# Patient Record
Sex: Female | Born: 1950 | Race: White | Hispanic: No | Marital: Married | Smoking: Former smoker
Health system: Southern US, Community
[De-identification: ages and names within clinical notes are randomized; demographics above are authoritative.]

## PROBLEM LIST (undated history)

## (undated) DIAGNOSIS — R42 Dizziness and giddiness: Secondary | ICD-10-CM

## (undated) DIAGNOSIS — M8580 Other specified disorders of bone density and structure, unspecified site: Secondary | ICD-10-CM

## (undated) DIAGNOSIS — E785 Hyperlipidemia, unspecified: Secondary | ICD-10-CM

## (undated) DIAGNOSIS — K589 Irritable bowel syndrome without diarrhea: Secondary | ICD-10-CM

## (undated) DIAGNOSIS — E041 Nontoxic single thyroid nodule: Secondary | ICD-10-CM

## (undated) DIAGNOSIS — K5790 Diverticulosis of intestine, part unspecified, without perforation or abscess without bleeding: Secondary | ICD-10-CM

## (undated) DIAGNOSIS — F419 Anxiety disorder, unspecified: Secondary | ICD-10-CM

## (undated) DIAGNOSIS — I67858 Other hereditary cerebrovascular disease: Secondary | ICD-10-CM

## (undated) DIAGNOSIS — E7800 Pure hypercholesterolemia, unspecified: Secondary | ICD-10-CM

## (undated) DIAGNOSIS — C801 Malignant (primary) neoplasm, unspecified: Secondary | ICD-10-CM

## (undated) DIAGNOSIS — K635 Polyp of colon: Secondary | ICD-10-CM

## (undated) DIAGNOSIS — I10 Essential (primary) hypertension: Secondary | ICD-10-CM

## (undated) DIAGNOSIS — I679 Cerebrovascular disease, unspecified: Secondary | ICD-10-CM

## (undated) DIAGNOSIS — F32A Depression, unspecified: Secondary | ICD-10-CM

## (undated) HISTORY — DX: Polyp of colon: K63.5

## (undated) HISTORY — DX: Other specified disorders of bone density and structure, unspecified site: M85.80

## (undated) HISTORY — DX: Diverticulosis of intestine, part unspecified, without perforation or abscess without bleeding: K57.90

## (undated) HISTORY — DX: Nontoxic single thyroid nodule: E04.1

## (undated) HISTORY — DX: Essential (primary) hypertension: I10

## (undated) HISTORY — DX: Irritable bowel syndrome, unspecified: K58.9

## (undated) HISTORY — PX: TONSILLECTOMY: SUR1361

## (undated) HISTORY — DX: Other hereditary cerebrovascular disease: I67.858

## (undated) HISTORY — DX: Dizziness and giddiness: R42

## (undated) HISTORY — PX: CHOLECYSTECTOMY: SHX55

## (undated) HISTORY — PX: ADENOIDECTOMY: SUR15

## (undated) HISTORY — DX: Hyperlipidemia, unspecified: E78.5

## (undated) HISTORY — DX: Anxiety disorder, unspecified: F41.9

## (undated) HISTORY — DX: Depression, unspecified: F32.A

## (undated) HISTORY — DX: Pure hypercholesterolemia, unspecified: E78.00

## (undated) HISTORY — DX: Cerebrovascular disease, unspecified: I67.9

---

## 1998-08-02 ENCOUNTER — Other Ambulatory Visit: Admission: RE | Admit: 1998-08-02 | Discharge: 1998-08-02 | Payer: Self-pay | Admitting: Obstetrics and Gynecology

## 1998-08-03 ENCOUNTER — Other Ambulatory Visit: Admission: RE | Admit: 1998-08-03 | Discharge: 1998-08-03 | Payer: Self-pay | Admitting: Obstetrics and Gynecology

## 2017-06-12 DIAGNOSIS — C211 Malignant neoplasm of anal canal: Secondary | ICD-10-CM | POA: Diagnosis not present

## 2017-06-12 DIAGNOSIS — R590 Localized enlarged lymph nodes: Secondary | ICD-10-CM | POA: Diagnosis not present

## 2017-06-18 DIAGNOSIS — R590 Localized enlarged lymph nodes: Secondary | ICD-10-CM | POA: Diagnosis not present

## 2017-06-18 DIAGNOSIS — C211 Malignant neoplasm of anal canal: Secondary | ICD-10-CM | POA: Diagnosis not present

## 2017-06-30 DIAGNOSIS — C211 Malignant neoplasm of anal canal: Secondary | ICD-10-CM | POA: Diagnosis not present

## 2017-08-10 DIAGNOSIS — C211 Malignant neoplasm of anal canal: Secondary | ICD-10-CM | POA: Diagnosis not present

## 2017-08-20 DIAGNOSIS — R110 Nausea: Secondary | ICD-10-CM | POA: Diagnosis not present

## 2017-08-20 DIAGNOSIS — R197 Diarrhea, unspecified: Secondary | ICD-10-CM

## 2017-08-20 DIAGNOSIS — C210 Malignant neoplasm of anus, unspecified: Secondary | ICD-10-CM | POA: Diagnosis not present

## 2017-08-20 DIAGNOSIS — D72810 Lymphocytopenia: Secondary | ICD-10-CM

## 2017-08-20 DIAGNOSIS — D649 Anemia, unspecified: Secondary | ICD-10-CM | POA: Diagnosis not present

## 2017-08-21 DIAGNOSIS — D72810 Lymphocytopenia: Secondary | ICD-10-CM | POA: Diagnosis not present

## 2017-08-21 DIAGNOSIS — C210 Malignant neoplasm of anus, unspecified: Secondary | ICD-10-CM | POA: Diagnosis not present

## 2017-08-21 DIAGNOSIS — R110 Nausea: Secondary | ICD-10-CM | POA: Diagnosis not present

## 2017-08-21 DIAGNOSIS — R197 Diarrhea, unspecified: Secondary | ICD-10-CM | POA: Diagnosis not present

## 2017-08-21 DIAGNOSIS — D759 Disease of blood and blood-forming organs, unspecified: Secondary | ICD-10-CM

## 2017-08-22 DIAGNOSIS — E876 Hypokalemia: Secondary | ICD-10-CM | POA: Diagnosis not present

## 2017-08-22 DIAGNOSIS — E46 Unspecified protein-calorie malnutrition: Secondary | ICD-10-CM | POA: Diagnosis not present

## 2017-08-22 DIAGNOSIS — C210 Malignant neoplasm of anus, unspecified: Secondary | ICD-10-CM | POA: Diagnosis not present

## 2017-08-22 DIAGNOSIS — R110 Nausea: Secondary | ICD-10-CM | POA: Diagnosis not present

## 2017-08-22 DIAGNOSIS — D61818 Other pancytopenia: Secondary | ICD-10-CM

## 2017-08-22 DIAGNOSIS — R197 Diarrhea, unspecified: Secondary | ICD-10-CM | POA: Diagnosis not present

## 2017-08-22 DIAGNOSIS — Z923 Personal history of irradiation: Secondary | ICD-10-CM

## 2017-08-22 DIAGNOSIS — K521 Toxic gastroenteritis and colitis: Secondary | ICD-10-CM | POA: Diagnosis not present

## 2017-08-22 DIAGNOSIS — C211 Malignant neoplasm of anal canal: Secondary | ICD-10-CM

## 2017-08-22 DIAGNOSIS — Z9221 Personal history of antineoplastic chemotherapy: Secondary | ICD-10-CM | POA: Diagnosis not present

## 2017-08-22 DIAGNOSIS — D72810 Lymphocytopenia: Secondary | ICD-10-CM | POA: Diagnosis not present

## 2017-08-23 DIAGNOSIS — R110 Nausea: Secondary | ICD-10-CM | POA: Diagnosis not present

## 2017-08-23 DIAGNOSIS — R300 Dysuria: Secondary | ICD-10-CM | POA: Diagnosis not present

## 2017-08-23 DIAGNOSIS — D61810 Antineoplastic chemotherapy induced pancytopenia: Secondary | ICD-10-CM

## 2017-08-23 DIAGNOSIS — Z923 Personal history of irradiation: Secondary | ICD-10-CM

## 2017-08-23 DIAGNOSIS — D72810 Lymphocytopenia: Secondary | ICD-10-CM | POA: Diagnosis not present

## 2017-08-23 DIAGNOSIS — C211 Malignant neoplasm of anal canal: Secondary | ICD-10-CM | POA: Diagnosis not present

## 2017-08-23 DIAGNOSIS — C210 Malignant neoplasm of anus, unspecified: Secondary | ICD-10-CM | POA: Diagnosis not present

## 2017-08-23 DIAGNOSIS — Z9221 Personal history of antineoplastic chemotherapy: Secondary | ICD-10-CM | POA: Diagnosis not present

## 2017-08-23 DIAGNOSIS — R197 Diarrhea, unspecified: Secondary | ICD-10-CM | POA: Diagnosis not present

## 2017-08-24 DIAGNOSIS — R197 Diarrhea, unspecified: Secondary | ICD-10-CM | POA: Diagnosis not present

## 2017-08-24 DIAGNOSIS — C210 Malignant neoplasm of anus, unspecified: Secondary | ICD-10-CM | POA: Diagnosis not present

## 2017-08-24 DIAGNOSIS — R110 Nausea: Secondary | ICD-10-CM | POA: Diagnosis not present

## 2017-08-24 DIAGNOSIS — D72810 Lymphocytopenia: Secondary | ICD-10-CM | POA: Diagnosis not present

## 2017-09-24 DIAGNOSIS — Z923 Personal history of irradiation: Secondary | ICD-10-CM | POA: Diagnosis not present

## 2017-09-24 DIAGNOSIS — Z9221 Personal history of antineoplastic chemotherapy: Secondary | ICD-10-CM | POA: Diagnosis not present

## 2017-09-24 DIAGNOSIS — Z85048 Personal history of other malignant neoplasm of rectum, rectosigmoid junction, and anus: Secondary | ICD-10-CM | POA: Diagnosis not present

## 2017-11-05 DIAGNOSIS — Z9221 Personal history of antineoplastic chemotherapy: Secondary | ICD-10-CM | POA: Diagnosis not present

## 2017-11-05 DIAGNOSIS — Z923 Personal history of irradiation: Secondary | ICD-10-CM | POA: Diagnosis not present

## 2017-11-05 DIAGNOSIS — Z85048 Personal history of other malignant neoplasm of rectum, rectosigmoid junction, and anus: Secondary | ICD-10-CM | POA: Diagnosis not present

## 2017-12-09 ENCOUNTER — Ambulatory Visit (INDEPENDENT_AMBULATORY_CARE_PROVIDER_SITE_OTHER): Payer: BLUE CROSS/BLUE SHIELD

## 2017-12-09 ENCOUNTER — Ambulatory Visit: Payer: BLUE CROSS/BLUE SHIELD | Admitting: Sports Medicine

## 2017-12-09 ENCOUNTER — Encounter: Payer: Self-pay | Admitting: Sports Medicine

## 2017-12-09 VITALS — BP 112/69 | HR 97 | Temp 97.6°F | Resp 16 | Ht 63.0 in | Wt 140.0 lb

## 2017-12-09 DIAGNOSIS — M79672 Pain in left foot: Secondary | ICD-10-CM | POA: Diagnosis not present

## 2017-12-09 DIAGNOSIS — M79671 Pain in right foot: Secondary | ICD-10-CM

## 2017-12-09 DIAGNOSIS — G588 Other specified mononeuropathies: Secondary | ICD-10-CM | POA: Diagnosis not present

## 2017-12-09 DIAGNOSIS — M779 Enthesopathy, unspecified: Secondary | ICD-10-CM

## 2017-12-09 MED ORDER — TRIAMCINOLONE ACETONIDE 10 MG/ML IJ SUSP: 10.0000 mg | Freq: Once | INTRAMUSCULAR | Status: AC

## 2017-12-09 NOTE — Progress Notes (Signed)
Subjective: Chelsea Norman is a 67 y.o. female patient who presents to office for evaluation of left and right foot pain for years. Patient complains of progressive pain especially over the last few months that she wanted to have evaluated before she starts working back out at [employer], ranks pain 7/10 with numbness and pain to the left second third and fourth toes history of neuroma several years ago and has tried treating it with good supportive shoes and resting.  Patient denies any injury to the left foot.  Patient also has pain along the lateral side of the right foot and ankle states that the pain is off and on more of a bothersome and heart to describe the discomfort that she feels since it is so occasional.  Patient states that it is difficult to rank the pain because it comes and goes.  Patient has tried ibuprofen with no relief in symptoms. Patient denies any other pedal complaints. Denies injury/trip/fall/sprain/any causative factors.   Admits a history of cancer and has completed chemo and radiation.  Review of Systems  Musculoskeletal: Positive for joint pain and myalgias.  Neurological: Positive for sensory change.  All other systems reviewed and are negative.    There are no active problems to display for this patient.   Current Outpatient Medications on File Prior to Visit  Medication Sig Dispense Refill  . LORazepam (ATIVAN) 0.5 MG tablet Take 0.5 mg by mouth every 8 (eight) hours.     No current facility-administered medications on file prior to visit.     Not on File  Objective:  General: Alert and oriented x3 in no acute distress  Dermatology: No open lesions bilateral lower extremities, no webspace macerations, no ecchymosis bilateral, all nails x 10 are well manicured.  Vascular: Dorsalis Pedis and Posterior Tibial pedal pulses palpable 1 out of 4, Capillary Fill Time 3 seconds,(+) pedal hair growth bilateral, no edema bilateral lower extremities, mild  varicosities bilateral, temperature gradient within normal limits.  Neurology: Gross sensation intact via light touch bilateral, negative Mulder sign on the left however there is mild pain in between the second and third and third and fourth toes on the left.  Musculoskeletal: Mild tenderness with palpation at left forefoot and right lateral foot and ankle along the peroneal tendon course, range of motion normal however there is mild guarding at the right foot and ankle, strength within normal limits in all groups bilateral.  Bunion and hammertoe deformity bilateral.  Gait: Mildly antalgic gait  Xrays  Right and left foot   Impression: Normal osseous mineralization, no fracture, bunion and hammertoe deformity no other acute findings.  Assessment and Plan: Problem List Items Addressed This Visit    None    Visit Diagnoses    Foot pain, bilateral    -  Primary   Relevant Orders   DG Foot Complete Right   DG Foot Complete Left   Neuroma digital nerve       L   Relevant Medications   triamcinolone acetonide (KENALOG) 10 MG/ML injection 10 mg (Start on 12/09/2017  2:00 PM)   Tendonitis       R      -Complete examination performed -Xrays reviewed -Discussed treatement options for neuroma on left and tendinitis on the right -After oral consent and aseptic prep, injected a mixture containing 1 ml of 2%  plain lidocaine, 1 ml 0.5% plain marcaine, 0.5 ml of kenalog 10 and 0.5 ml of dexamethasone phosphate into left second and  third webspacewithout complication. Post-injection care discussed with patient.  -Recommend rest ice elevation for the left -Dispensed an ankle gauntlet to use on the right to assist with offloading the peroneal tendons which is likely inflamed and causing problems with her gait and activity -Advised rest ice elevation for the right foot and ankle Recommend slowly to increase activities to tolerance -Patient to return to office in 4 weeks for follow-up evaluation or  sooner if condition worsens.  Landis Martins, DPM

## 2017-12-10 ENCOUNTER — Other Ambulatory Visit: Payer: Self-pay | Admitting: Sports Medicine

## 2017-12-10 DIAGNOSIS — M779 Enthesopathy, unspecified: Secondary | ICD-10-CM

## 2017-12-10 DIAGNOSIS — M79671 Pain in right foot: Secondary | ICD-10-CM

## 2017-12-10 DIAGNOSIS — M79672 Pain in left foot: Principal | ICD-10-CM

## 2017-12-10 DIAGNOSIS — G588 Other specified mononeuropathies: Secondary | ICD-10-CM

## 2018-01-08 ENCOUNTER — Encounter: Payer: Self-pay | Admitting: Sports Medicine

## 2018-01-08 ENCOUNTER — Ambulatory Visit: Payer: BLUE CROSS/BLUE SHIELD | Admitting: Sports Medicine

## 2018-01-08 DIAGNOSIS — M779 Enthesopathy, unspecified: Secondary | ICD-10-CM | POA: Diagnosis not present

## 2018-01-08 DIAGNOSIS — G588 Other specified mononeuropathies: Secondary | ICD-10-CM | POA: Diagnosis not present

## 2018-01-08 DIAGNOSIS — M79672 Pain in left foot: Secondary | ICD-10-CM | POA: Diagnosis not present

## 2018-01-08 DIAGNOSIS — M79671 Pain in right foot: Secondary | ICD-10-CM

## 2018-01-08 NOTE — Progress Notes (Signed)
Subjective: Chelsea Norman is a 67 y.o. female patient returns to office for follow up evaluation of bilateral foot pain. Patient reports that she feels much better. No pain to left foot at the ball however still feels a little pain every now and again at right ankle. 50% improved. Brace helps. Denies new symptoms or swelling, brusing, cramping, or any other symptoms at this time.   There are no active problems to display for this patient.   Current Outpatient Medications on File Prior to Visit  Medication Sig Dispense Refill  . calcium-vitamin D (OSCAL WITH D) 500-200 MG-UNIT tablet Take 1 tablet by mouth.    . fexofenadine (ALLEGRA) 180 MG tablet Take 180 mg by mouth daily.    Marland Kitchen LORazepam (ATIVAN) 0.5 MG tablet Take 0.5 mg by mouth every 8 (eight) hours.    Marland Kitchen venlafaxine XR (EFFEXOR-XR) 150 MG 24 hr capsule Take 150 mg by mouth daily with breakfast.     Current Facility-Administered Medications on File Prior to Visit  Medication Dose Route Frequency Provider Last Rate Last Dose  . triamcinolone acetonide (KENALOG) 10 MG/ML injection 10 mg  10 mg Other Once Landis Martins, DPM        Allergies  Allergen Reactions  . Penicillins     Objective:  General: Alert and oriented x3 in no acute distress  Dermatology: No open lesions bilateral lower extremities, no webspace macerations, no ecchymosis bilateral, all nails x 10 are well manicured.  Vascular: Dorsalis Pedis and Posterior Tibial pedal pulses palpable 1 out of 4, Capillary Fill Time 3 seconds,(+) pedal hair growth bilateral, no edema bilateral lower extremities, mild varicosities bilateral, temperature gradient within normal limits.  Neurology: Gross sensation intact via light touch bilateral.  Musculoskeletal: No reproducible tenderness with palpation at left forefoot and right lateral foot and ankle along the peroneal tendon course, range of motion normal, strength within normal limits in all groups bilateral.  Bunion and  hammertoe deformity bilateral.  Assessment and Plan: Problem List Items Addressed This Visit    None    Visit Diagnoses    Tendonitis    -  Primary   R   Neuroma digital nerve       L   Foot pain, bilateral          -Complete examination performed -Discussed continued care for neuroma on left and tendinitis on the right -Continue with brace on right -Recommend avoid strenenous activity that could cause a flare in symptoms -Continue with good supportive shoes -Patient to return to office as needed or sooner if condition worsens.  Landis Martins, DPM

## 2018-01-27 DIAGNOSIS — C211 Malignant neoplasm of anal canal: Secondary | ICD-10-CM | POA: Diagnosis not present

## 2018-03-29 DIAGNOSIS — R52 Pain, unspecified: Secondary | ICD-10-CM | POA: Diagnosis not present

## 2018-03-29 DIAGNOSIS — Z809 Family history of malignant neoplasm, unspecified: Secondary | ICD-10-CM | POA: Diagnosis not present

## 2018-03-29 DIAGNOSIS — H269 Unspecified cataract: Secondary | ICD-10-CM | POA: Diagnosis not present

## 2018-03-29 DIAGNOSIS — Z791 Long term (current) use of non-steroidal anti-inflammatories (NSAID): Secondary | ICD-10-CM | POA: Diagnosis not present

## 2018-03-29 DIAGNOSIS — K30 Functional dyspepsia: Secondary | ICD-10-CM | POA: Diagnosis not present

## 2018-03-29 DIAGNOSIS — R69 Illness, unspecified: Secondary | ICD-10-CM | POA: Diagnosis not present

## 2018-03-29 DIAGNOSIS — J302 Other seasonal allergic rhinitis: Secondary | ICD-10-CM | POA: Diagnosis not present

## 2018-03-29 DIAGNOSIS — Z818 Family history of other mental and behavioral disorders: Secondary | ICD-10-CM | POA: Diagnosis not present

## 2018-03-29 DIAGNOSIS — F419 Anxiety disorder, unspecified: Secondary | ICD-10-CM | POA: Diagnosis not present

## 2018-05-03 DIAGNOSIS — R69 Illness, unspecified: Secondary | ICD-10-CM | POA: Diagnosis not present

## 2018-05-07 DIAGNOSIS — Z923 Personal history of irradiation: Secondary | ICD-10-CM | POA: Diagnosis not present

## 2018-05-07 DIAGNOSIS — Z9221 Personal history of antineoplastic chemotherapy: Secondary | ICD-10-CM | POA: Diagnosis not present

## 2018-05-07 DIAGNOSIS — Z85048 Personal history of other malignant neoplasm of rectum, rectosigmoid junction, and anus: Secondary | ICD-10-CM | POA: Diagnosis not present

## 2018-05-17 DIAGNOSIS — K5730 Diverticulosis of large intestine without perforation or abscess without bleeding: Secondary | ICD-10-CM | POA: Diagnosis not present

## 2018-05-17 DIAGNOSIS — Z85048 Personal history of other malignant neoplasm of rectum, rectosigmoid junction, and anus: Secondary | ICD-10-CM | POA: Diagnosis not present

## 2018-05-24 DIAGNOSIS — R69 Illness, unspecified: Secondary | ICD-10-CM | POA: Diagnosis not present

## 2018-06-01 DIAGNOSIS — Z923 Personal history of irradiation: Secondary | ICD-10-CM | POA: Diagnosis not present

## 2018-06-01 DIAGNOSIS — E7800 Pure hypercholesterolemia, unspecified: Secondary | ICD-10-CM | POA: Diagnosis not present

## 2018-06-01 DIAGNOSIS — Z87891 Personal history of nicotine dependence: Secondary | ICD-10-CM | POA: Diagnosis not present

## 2018-06-01 DIAGNOSIS — Z79899 Other long term (current) drug therapy: Secondary | ICD-10-CM | POA: Diagnosis not present

## 2018-06-01 DIAGNOSIS — Z85048 Personal history of other malignant neoplasm of rectum, rectosigmoid junction, and anus: Secondary | ICD-10-CM | POA: Diagnosis not present

## 2018-06-01 DIAGNOSIS — Z86010 Personal history of colonic polyps: Secondary | ICD-10-CM | POA: Diagnosis not present

## 2018-06-01 DIAGNOSIS — C210 Malignant neoplasm of anus, unspecified: Secondary | ICD-10-CM | POA: Diagnosis not present

## 2018-06-01 DIAGNOSIS — Z08 Encounter for follow-up examination after completed treatment for malignant neoplasm: Secondary | ICD-10-CM | POA: Diagnosis not present

## 2018-06-01 DIAGNOSIS — Z9221 Personal history of antineoplastic chemotherapy: Secondary | ICD-10-CM | POA: Diagnosis not present

## 2018-06-01 DIAGNOSIS — R69 Illness, unspecified: Secondary | ICD-10-CM | POA: Diagnosis not present

## 2018-06-04 DIAGNOSIS — Z6825 Body mass index (BMI) 25.0-25.9, adult: Secondary | ICD-10-CM | POA: Diagnosis not present

## 2018-06-04 DIAGNOSIS — E7800 Pure hypercholesterolemia, unspecified: Secondary | ICD-10-CM | POA: Diagnosis not present

## 2018-06-04 DIAGNOSIS — R69 Illness, unspecified: Secondary | ICD-10-CM | POA: Diagnosis not present

## 2018-06-04 DIAGNOSIS — Z79899 Other long term (current) drug therapy: Secondary | ICD-10-CM | POA: Diagnosis not present

## 2018-06-04 DIAGNOSIS — L309 Dermatitis, unspecified: Secondary | ICD-10-CM | POA: Diagnosis not present

## 2018-06-24 DIAGNOSIS — R7989 Other specified abnormal findings of blood chemistry: Secondary | ICD-10-CM | POA: Diagnosis not present

## 2018-06-24 DIAGNOSIS — R209 Unspecified disturbances of skin sensation: Secondary | ICD-10-CM | POA: Diagnosis not present

## 2018-07-06 DIAGNOSIS — Z1231 Encounter for screening mammogram for malignant neoplasm of breast: Secondary | ICD-10-CM | POA: Diagnosis not present

## 2018-07-15 DIAGNOSIS — Z6824 Body mass index (BMI) 24.0-24.9, adult: Secondary | ICD-10-CM | POA: Diagnosis not present

## 2018-07-15 DIAGNOSIS — J069 Acute upper respiratory infection, unspecified: Secondary | ICD-10-CM | POA: Diagnosis not present

## 2018-07-30 DIAGNOSIS — Z85048 Personal history of other malignant neoplasm of rectum, rectosigmoid junction, and anus: Secondary | ICD-10-CM | POA: Diagnosis not present

## 2018-11-05 DIAGNOSIS — Z9221 Personal history of antineoplastic chemotherapy: Secondary | ICD-10-CM | POA: Diagnosis not present

## 2018-11-05 DIAGNOSIS — Z85048 Personal history of other malignant neoplasm of rectum, rectosigmoid junction, and anus: Secondary | ICD-10-CM | POA: Diagnosis not present

## 2018-11-05 DIAGNOSIS — Z923 Personal history of irradiation: Secondary | ICD-10-CM | POA: Diagnosis not present

## 2018-12-03 DIAGNOSIS — Z6825 Body mass index (BMI) 25.0-25.9, adult: Secondary | ICD-10-CM | POA: Diagnosis not present

## 2018-12-03 DIAGNOSIS — Z79899 Other long term (current) drug therapy: Secondary | ICD-10-CM | POA: Diagnosis not present

## 2018-12-03 DIAGNOSIS — R69 Illness, unspecified: Secondary | ICD-10-CM | POA: Diagnosis not present

## 2018-12-23 DIAGNOSIS — R002 Palpitations: Secondary | ICD-10-CM | POA: Diagnosis not present

## 2018-12-23 DIAGNOSIS — R238 Other skin changes: Secondary | ICD-10-CM | POA: Diagnosis not present

## 2018-12-23 DIAGNOSIS — Z6825 Body mass index (BMI) 25.0-25.9, adult: Secondary | ICD-10-CM | POA: Diagnosis not present

## 2019-01-05 ENCOUNTER — Ambulatory Visit (INDEPENDENT_AMBULATORY_CARE_PROVIDER_SITE_OTHER): Payer: Medicare HMO

## 2019-01-05 ENCOUNTER — Other Ambulatory Visit: Payer: Self-pay | Admitting: Sports Medicine

## 2019-01-05 ENCOUNTER — Ambulatory Visit (INDEPENDENT_AMBULATORY_CARE_PROVIDER_SITE_OTHER): Payer: Medicare HMO | Admitting: Sports Medicine

## 2019-01-05 ENCOUNTER — Encounter: Payer: Self-pay | Admitting: Sports Medicine

## 2019-01-05 ENCOUNTER — Other Ambulatory Visit: Payer: Self-pay

## 2019-01-05 VITALS — Temp 97.3°F | Resp 16

## 2019-01-05 DIAGNOSIS — M779 Enthesopathy, unspecified: Secondary | ICD-10-CM

## 2019-01-05 DIAGNOSIS — G588 Other specified mononeuropathies: Secondary | ICD-10-CM

## 2019-01-05 DIAGNOSIS — M79675 Pain in left toe(s): Secondary | ICD-10-CM

## 2019-01-05 MED ORDER — TRIAMCINOLONE ACETONIDE 10 MG/ML IJ SUSP
10.0000 mg | Freq: Once | INTRAMUSCULAR | Status: AC
  Administered 2019-01-05: 10 mg

## 2019-01-05 NOTE — Progress Notes (Signed)
Subjective: Chelsea Norman is a 68 y.o. female patient who returns to office for follow up evaluation of left foot pain.  Patient reports that since September of last year she has been having pain that is worse with hiking or doing a lot of walking and standing underneath the second third and fourth toes reports that the pain can be as bad as 8-9 out of 10 states that the pain causes her to limp and has tried resting with some improvement however pain worsens with activity and use.  Patient admits that her right foot and ankle occasionally bothers her but is doing much better as long as she is active with the brace that we gave her last visit.  Patient denies any changes with medications or health history since last encounter.  Patient denies nausea vomiting fever chills or any other constitutional symptoms at this time.  There are no active problems to display for this patient.  Current Outpatient Medications on File Prior to Visit  Medication Sig Dispense Refill  . calcium-vitamin D (OSCAL WITH D) 500-200 MG-UNIT tablet Take 1 tablet by mouth.    . fexofenadine (ALLEGRA) 180 MG tablet Take 180 mg by mouth daily.    Marland Kitchen LORazepam (ATIVAN) 0.5 MG tablet Take 0.5 mg by mouth every 8 (eight) hours.    Marland Kitchen venlafaxine XR (EFFEXOR-XR) 150 MG 24 hr capsule Take 150 mg by mouth daily with breakfast.     Current Facility-Administered Medications on File Prior to Visit  Medication Dose Route Frequency Provider Last Rate Last Dose  . triamcinolone acetonide (KENALOG) 10 MG/ML injection 10 mg  10 mg Other Once Landis Martins, DPM       Allergies  Allergen Reactions  . Penicillins     Objective:  General: Alert and oriented x3 in no acute distress  Dermatology: No open lesions bilateral lower extremities, no webspace macerations, no ecchymosis bilateral, all nails x 10 are well manicured.  Neurovascular: Intact. No lower extremity edema bilateral.   Musculoskeletal: There is pain to palpation  diffusely plantar second third and fourth metatarsophalangeal joints on the left foot.  There is subjective occasional pain but nothing reproducible at the right lateral foot along the peroneal tendon course right greater than left. Range of motion in all pedal joints are within normal limits without pain or crepitation except at bunion deformity that is bilateral. Strength within normal limits in all groups bilateral.   X-ray left foot no acute or new findings from previous  Assessment and Plan: Problem List Items Addressed This Visit    None    Visit Diagnoses    Neuroma digital nerve    -  Primary   Relevant Medications   triamcinolone acetonide (KENALOG) 10 MG/ML injection 10 mg (Start on 01/05/2019 12:45 PM)   Other Relevant Orders   Uric acid   Sedimentation rate   C-reactive protein   Rheumatoid factor   ANA, IFA Comprehensive Panel   HLA-B27 antigen   CBC with Differential/Platelet   Capsulitis       Relevant Medications   triamcinolone acetonide (KENALOG) 10 MG/ML injection 10 mg (Start on 01/05/2019 12:45 PM)   Other Relevant Orders   Uric acid   Sedimentation rate   C-reactive protein   Rheumatoid factor   ANA, IFA Comprehensive Panel   HLA-B27 antigen   CBC with Differential/Platelet   Toe pain, left       Relevant Medications   triamcinolone acetonide (KENALOG) 10 MG/ML injection 10 mg (Start on  01/05/2019 12:45 PM)      -Complete examination performed -Discussed treatement options for capsulitis versus neuroma with possibility of underlying inflammatory conditions since this keeps recurring versus mechanical overuse -After oral consent and aseptic prep, injected a mixture containing 1 ml of 2%  plain lidocaine, 1 ml 0.5% plain marcaine, 0.5 ml of kenalog 10 and 0.5 ml of dexamethasone phosphate into left plantar forefoot without complication. Post-injection care discussed with patient.  -Rx arthritic panel for further evaluation advised patient to have her blood  work sent from her PCPs office and in addition to get the arthritic panel will call patient with results and discuss possible follow-up treatment course and plan -Patient to return to office after blood work or sooner if condition worsens.  Landis Martins, DPM

## 2019-01-06 DIAGNOSIS — G588 Other specified mononeuropathies: Secondary | ICD-10-CM | POA: Diagnosis not present

## 2019-01-06 DIAGNOSIS — M779 Enthesopathy, unspecified: Secondary | ICD-10-CM | POA: Diagnosis not present

## 2019-01-13 DIAGNOSIS — R2689 Other abnormalities of gait and mobility: Secondary | ICD-10-CM | POA: Diagnosis not present

## 2019-01-13 DIAGNOSIS — Z6825 Body mass index (BMI) 25.0-25.9, adult: Secondary | ICD-10-CM | POA: Diagnosis not present

## 2019-01-13 DIAGNOSIS — Z859 Personal history of malignant neoplasm, unspecified: Secondary | ICD-10-CM | POA: Diagnosis not present

## 2019-01-13 DIAGNOSIS — W19XXXA Unspecified fall, initial encounter: Secondary | ICD-10-CM | POA: Diagnosis not present

## 2019-01-13 DIAGNOSIS — R51 Headache: Secondary | ICD-10-CM | POA: Diagnosis not present

## 2019-01-18 ENCOUNTER — Other Ambulatory Visit: Payer: Self-pay | Admitting: Sports Medicine

## 2019-01-18 DIAGNOSIS — M6281 Muscle weakness (generalized): Secondary | ICD-10-CM | POA: Diagnosis not present

## 2019-01-18 DIAGNOSIS — R2681 Unsteadiness on feet: Secondary | ICD-10-CM | POA: Diagnosis not present

## 2019-01-18 DIAGNOSIS — G588 Other specified mononeuropathies: Secondary | ICD-10-CM

## 2019-01-18 DIAGNOSIS — R2689 Other abnormalities of gait and mobility: Secondary | ICD-10-CM | POA: Diagnosis not present

## 2019-01-18 DIAGNOSIS — M779 Enthesopathy, unspecified: Secondary | ICD-10-CM

## 2019-01-26 DIAGNOSIS — M6281 Muscle weakness (generalized): Secondary | ICD-10-CM | POA: Diagnosis not present

## 2019-01-26 DIAGNOSIS — R2689 Other abnormalities of gait and mobility: Secondary | ICD-10-CM | POA: Diagnosis not present

## 2019-01-26 DIAGNOSIS — R2681 Unsteadiness on feet: Secondary | ICD-10-CM | POA: Diagnosis not present

## 2019-01-28 ENCOUNTER — Ambulatory Visit (INDEPENDENT_AMBULATORY_CARE_PROVIDER_SITE_OTHER): Payer: Medicare HMO | Admitting: Sports Medicine

## 2019-01-28 ENCOUNTER — Other Ambulatory Visit: Payer: Self-pay

## 2019-01-28 ENCOUNTER — Ambulatory Visit (INDEPENDENT_AMBULATORY_CARE_PROVIDER_SITE_OTHER): Payer: Medicare HMO

## 2019-01-28 ENCOUNTER — Other Ambulatory Visit: Payer: Self-pay | Admitting: Sports Medicine

## 2019-01-28 ENCOUNTER — Encounter: Payer: Self-pay | Admitting: Sports Medicine

## 2019-01-28 VITALS — Temp 97.0°F | Resp 16

## 2019-01-28 DIAGNOSIS — R52 Pain, unspecified: Secondary | ICD-10-CM

## 2019-01-28 DIAGNOSIS — M779 Enthesopathy, unspecified: Secondary | ICD-10-CM

## 2019-01-28 DIAGNOSIS — M79675 Pain in left toe(s): Secondary | ICD-10-CM | POA: Diagnosis not present

## 2019-01-28 DIAGNOSIS — M7752 Other enthesopathy of left foot: Secondary | ICD-10-CM | POA: Diagnosis not present

## 2019-01-28 DIAGNOSIS — G588 Other specified mononeuropathies: Secondary | ICD-10-CM

## 2019-01-28 MED ORDER — TRIAMCINOLONE ACETONIDE 10 MG/ML IJ SUSP
10.0000 mg | Freq: Once | INTRAMUSCULAR | Status: AC
  Administered 2019-01-28: 10:00:00 10 mg

## 2019-01-28 NOTE — Progress Notes (Signed)
Subjective: Chelsea Norman is a 68 y.o. female patient who returns to office for follow up evaluation of left foot pain.  Patient reports that pain has flared back up after starting PT. Reports after PT on Wednesday had some pain to ball of left foot at toes 2-4. Patient reports that she got relief after last shot and reports that they called her about her blood work and it was negative for arthritis. Patient denies nausea vomiting fever chills or any other constitutional symptoms at this time.  There are no active problems to display for this patient.  Current Outpatient Medications on File Prior to Visit  Medication Sig Dispense Refill  . calcium-vitamin D (OSCAL WITH D) 500-200 MG-UNIT tablet Take 1 tablet by mouth.    . fexofenadine (ALLEGRA) 180 MG tablet Take 180 mg by mouth daily.    Marland Kitchen LORazepam (ATIVAN) 0.5 MG tablet Take 0.5 mg by mouth every 8 (eight) hours.    Marland Kitchen venlafaxine XR (EFFEXOR-XR) 150 MG 24 hr capsule Take 150 mg by mouth daily with breakfast.     Current Facility-Administered Medications on File Prior to Visit  Medication Dose Route Frequency Provider Last Rate Last Dose  . triamcinolone acetonide (KENALOG) 10 MG/ML injection 10 mg  10 mg Other Once Landis Martins, DPM       Allergies  Allergen Reactions  . Penicillins     Objective:  General: Alert and oriented x3 in no acute distress  Dermatology: No open lesions bilateral lower extremities, no webspace macerations, no ecchymosis bilateral, all nails x 10 are well manicured.  Neurovascular: Intact. No lower extremity edema bilateral.   Musculoskeletal: There is pain to palpation diffusely plantar second third and fourth metatarsophalangeal joints on the left foot with most pain under the 3rd MTPJ on left. Range of motion in all pedal joints are within normal limits without pain or crepitation except at bunion deformity that is bilateral. Strength within normal limits in all groups bilateral.   X-ray left  foot no acute or new findings from previous  Assessment and Plan: Problem List Items Addressed This Visit    None    Visit Diagnoses    Capsulitis    -  Primary   Relevant Medications   triamcinolone acetonide (KENALOG) 10 MG/ML injection 10 mg (Start on 01/28/2019  9:45 AM)   Toe pain, left       Relevant Medications   triamcinolone acetonide (KENALOG) 10 MG/ML injection 10 mg (Start on 01/28/2019  9:45 AM)   Neuroma digital nerve          -Complete examination performed -Xrays reviewed  -Discussed treatement options for capsulitis/neuroma versus mechanical overuse -After oral consent and aseptic prep, injected a mixture containing 1 ml of 2%  plain lidocaine, 1 ml 0.5% plain marcaine, 0.5 ml of kenalog 10 and 0.5 ml of dexamethasone phosphate into left plantar forefoot without complication. Injection #2 to area. Post-injection care discussed with patient.  -Advised patient to avoid activities that will inflame the ball -Advised patient to discuss with PT at her next visit -Advised patient if met pad works well will benefit from orthotics -Patient to return to office if not better after 2 weeks or sooner if condition worsens.  Landis Martins, DPM

## 2019-01-31 DIAGNOSIS — M6281 Muscle weakness (generalized): Secondary | ICD-10-CM | POA: Diagnosis not present

## 2019-01-31 DIAGNOSIS — R2681 Unsteadiness on feet: Secondary | ICD-10-CM | POA: Diagnosis not present

## 2019-01-31 DIAGNOSIS — R2689 Other abnormalities of gait and mobility: Secondary | ICD-10-CM | POA: Diagnosis not present

## 2019-02-02 ENCOUNTER — Other Ambulatory Visit: Payer: Self-pay | Admitting: Sports Medicine

## 2019-02-02 DIAGNOSIS — R52 Pain, unspecified: Secondary | ICD-10-CM

## 2019-02-02 DIAGNOSIS — M779 Enthesopathy, unspecified: Secondary | ICD-10-CM

## 2019-02-07 DIAGNOSIS — R2681 Unsteadiness on feet: Secondary | ICD-10-CM | POA: Diagnosis not present

## 2019-02-07 DIAGNOSIS — R2689 Other abnormalities of gait and mobility: Secondary | ICD-10-CM | POA: Diagnosis not present

## 2019-02-07 DIAGNOSIS — M6281 Muscle weakness (generalized): Secondary | ICD-10-CM | POA: Diagnosis not present

## 2019-02-10 DIAGNOSIS — R69 Illness, unspecified: Secondary | ICD-10-CM | POA: Diagnosis not present

## 2019-02-15 DIAGNOSIS — M6281 Muscle weakness (generalized): Secondary | ICD-10-CM | POA: Diagnosis not present

## 2019-02-15 DIAGNOSIS — R2681 Unsteadiness on feet: Secondary | ICD-10-CM | POA: Diagnosis not present

## 2019-02-15 DIAGNOSIS — R2689 Other abnormalities of gait and mobility: Secondary | ICD-10-CM | POA: Diagnosis not present

## 2019-02-21 DIAGNOSIS — R51 Headache: Secondary | ICD-10-CM | POA: Diagnosis not present

## 2019-02-21 DIAGNOSIS — S0990XA Unspecified injury of head, initial encounter: Secondary | ICD-10-CM | POA: Diagnosis not present

## 2019-02-21 DIAGNOSIS — W19XXXA Unspecified fall, initial encounter: Secondary | ICD-10-CM | POA: Diagnosis not present

## 2019-02-21 DIAGNOSIS — Z859 Personal history of malignant neoplasm, unspecified: Secondary | ICD-10-CM | POA: Diagnosis not present

## 2019-02-24 DIAGNOSIS — I679 Cerebrovascular disease, unspecified: Secondary | ICD-10-CM | POA: Diagnosis not present

## 2019-02-24 DIAGNOSIS — E785 Hyperlipidemia, unspecified: Secondary | ICD-10-CM | POA: Diagnosis not present

## 2019-03-01 DIAGNOSIS — R2681 Unsteadiness on feet: Secondary | ICD-10-CM | POA: Diagnosis not present

## 2019-03-01 DIAGNOSIS — R2689 Other abnormalities of gait and mobility: Secondary | ICD-10-CM | POA: Diagnosis not present

## 2019-03-01 DIAGNOSIS — M6281 Muscle weakness (generalized): Secondary | ICD-10-CM | POA: Diagnosis not present

## 2019-03-07 DIAGNOSIS — R2689 Other abnormalities of gait and mobility: Secondary | ICD-10-CM | POA: Diagnosis not present

## 2019-03-07 DIAGNOSIS — R2681 Unsteadiness on feet: Secondary | ICD-10-CM | POA: Diagnosis not present

## 2019-03-07 DIAGNOSIS — M6281 Muscle weakness (generalized): Secondary | ICD-10-CM | POA: Diagnosis not present

## 2019-03-18 DIAGNOSIS — R2681 Unsteadiness on feet: Secondary | ICD-10-CM | POA: Diagnosis not present

## 2019-03-18 DIAGNOSIS — R2689 Other abnormalities of gait and mobility: Secondary | ICD-10-CM | POA: Diagnosis not present

## 2019-03-18 DIAGNOSIS — M6281 Muscle weakness (generalized): Secondary | ICD-10-CM | POA: Diagnosis not present

## 2019-03-25 DIAGNOSIS — R69 Illness, unspecified: Secondary | ICD-10-CM | POA: Diagnosis not present

## 2019-04-08 DIAGNOSIS — Z1231 Encounter for screening mammogram for malignant neoplasm of breast: Secondary | ICD-10-CM | POA: Diagnosis not present

## 2019-04-08 DIAGNOSIS — Z1331 Encounter for screening for depression: Secondary | ICD-10-CM | POA: Diagnosis not present

## 2019-04-08 DIAGNOSIS — E2839 Other primary ovarian failure: Secondary | ICD-10-CM | POA: Diagnosis not present

## 2019-04-08 DIAGNOSIS — Z6825 Body mass index (BMI) 25.0-25.9, adult: Secondary | ICD-10-CM | POA: Diagnosis not present

## 2019-04-08 DIAGNOSIS — Z23 Encounter for immunization: Secondary | ICD-10-CM | POA: Diagnosis not present

## 2019-04-08 DIAGNOSIS — Z85048 Personal history of other malignant neoplasm of rectum, rectosigmoid junction, and anus: Secondary | ICD-10-CM | POA: Diagnosis not present

## 2019-04-08 DIAGNOSIS — Z1339 Encounter for screening examination for other mental health and behavioral disorders: Secondary | ICD-10-CM | POA: Diagnosis not present

## 2019-04-08 DIAGNOSIS — Z0000 Encounter for general adult medical examination without abnormal findings: Secondary | ICD-10-CM | POA: Diagnosis not present

## 2019-04-08 DIAGNOSIS — E663 Overweight: Secondary | ICD-10-CM | POA: Diagnosis not present

## 2019-04-08 DIAGNOSIS — R69 Illness, unspecified: Secondary | ICD-10-CM | POA: Diagnosis not present

## 2019-04-08 DIAGNOSIS — Z79899 Other long term (current) drug therapy: Secondary | ICD-10-CM | POA: Diagnosis not present

## 2019-04-25 DIAGNOSIS — I6523 Occlusion and stenosis of bilateral carotid arteries: Secondary | ICD-10-CM | POA: Diagnosis not present

## 2019-04-25 DIAGNOSIS — I679 Cerebrovascular disease, unspecified: Secondary | ICD-10-CM | POA: Diagnosis not present

## 2019-05-11 DIAGNOSIS — C211 Malignant neoplasm of anal canal: Secondary | ICD-10-CM | POA: Diagnosis not present

## 2019-05-11 DIAGNOSIS — C218 Malignant neoplasm of overlapping sites of rectum, anus and anal canal: Secondary | ICD-10-CM | POA: Diagnosis not present

## 2019-05-12 DIAGNOSIS — Z85048 Personal history of other malignant neoplasm of rectum, rectosigmoid junction, and anus: Secondary | ICD-10-CM | POA: Diagnosis not present

## 2019-05-23 DIAGNOSIS — Z86010 Personal history of colonic polyps: Secondary | ICD-10-CM | POA: Diagnosis not present

## 2019-05-23 DIAGNOSIS — C210 Malignant neoplasm of anus, unspecified: Secondary | ICD-10-CM | POA: Diagnosis not present

## 2019-06-06 DIAGNOSIS — Z6825 Body mass index (BMI) 25.0-25.9, adult: Secondary | ICD-10-CM | POA: Diagnosis not present

## 2019-06-06 DIAGNOSIS — R69 Illness, unspecified: Secondary | ICD-10-CM | POA: Diagnosis not present

## 2019-06-07 DIAGNOSIS — Z86010 Personal history of colonic polyps: Secondary | ICD-10-CM | POA: Diagnosis not present

## 2019-06-07 DIAGNOSIS — Z923 Personal history of irradiation: Secondary | ICD-10-CM | POA: Diagnosis not present

## 2019-06-07 DIAGNOSIS — Z85048 Personal history of other malignant neoplasm of rectum, rectosigmoid junction, and anus: Secondary | ICD-10-CM | POA: Diagnosis not present

## 2019-06-07 DIAGNOSIS — Z08 Encounter for follow-up examination after completed treatment for malignant neoplasm: Secondary | ICD-10-CM | POA: Diagnosis not present

## 2019-06-07 DIAGNOSIS — C210 Malignant neoplasm of anus, unspecified: Secondary | ICD-10-CM | POA: Diagnosis not present

## 2019-08-15 DIAGNOSIS — R69 Illness, unspecified: Secondary | ICD-10-CM | POA: Diagnosis not present

## 2019-09-01 DIAGNOSIS — H25013 Cortical age-related cataract, bilateral: Secondary | ICD-10-CM | POA: Diagnosis not present

## 2019-09-01 DIAGNOSIS — H524 Presbyopia: Secondary | ICD-10-CM | POA: Diagnosis not present

## 2019-09-01 DIAGNOSIS — H2513 Age-related nuclear cataract, bilateral: Secondary | ICD-10-CM | POA: Diagnosis not present

## 2019-09-05 DIAGNOSIS — E2839 Other primary ovarian failure: Secondary | ICD-10-CM | POA: Diagnosis not present

## 2019-09-05 DIAGNOSIS — M8589 Other specified disorders of bone density and structure, multiple sites: Secondary | ICD-10-CM | POA: Diagnosis not present

## 2019-09-07 DIAGNOSIS — Z1231 Encounter for screening mammogram for malignant neoplasm of breast: Secondary | ICD-10-CM | POA: Diagnosis not present

## 2019-09-07 DIAGNOSIS — Z6824 Body mass index (BMI) 24.0-24.9, adult: Secondary | ICD-10-CM | POA: Diagnosis not present

## 2019-09-07 DIAGNOSIS — E7800 Pure hypercholesterolemia, unspecified: Secondary | ICD-10-CM | POA: Diagnosis not present

## 2019-09-07 DIAGNOSIS — M5431 Sciatica, right side: Secondary | ICD-10-CM | POA: Diagnosis not present

## 2019-09-07 DIAGNOSIS — Z1331 Encounter for screening for depression: Secondary | ICD-10-CM | POA: Diagnosis not present

## 2019-09-07 DIAGNOSIS — Z79899 Other long term (current) drug therapy: Secondary | ICD-10-CM | POA: Diagnosis not present

## 2019-09-07 DIAGNOSIS — R69 Illness, unspecified: Secondary | ICD-10-CM | POA: Diagnosis not present

## 2019-10-19 DIAGNOSIS — E2839 Other primary ovarian failure: Secondary | ICD-10-CM | POA: Diagnosis not present

## 2019-10-19 DIAGNOSIS — Z1231 Encounter for screening mammogram for malignant neoplasm of breast: Secondary | ICD-10-CM | POA: Diagnosis not present

## 2019-11-03 DIAGNOSIS — Z85048 Personal history of other malignant neoplasm of rectum, rectosigmoid junction, and anus: Secondary | ICD-10-CM | POA: Diagnosis not present

## 2019-11-03 DIAGNOSIS — C211 Malignant neoplasm of anal canal: Secondary | ICD-10-CM | POA: Diagnosis not present

## 2019-12-09 DIAGNOSIS — F419 Anxiety disorder, unspecified: Secondary | ICD-10-CM | POA: Diagnosis not present

## 2019-12-09 DIAGNOSIS — R69 Illness, unspecified: Secondary | ICD-10-CM | POA: Diagnosis not present

## 2019-12-09 DIAGNOSIS — L551 Sunburn of second degree: Secondary | ICD-10-CM | POA: Diagnosis not present

## 2019-12-22 DIAGNOSIS — D2239 Melanocytic nevi of other parts of face: Secondary | ICD-10-CM | POA: Diagnosis not present

## 2019-12-22 DIAGNOSIS — D1801 Hemangioma of skin and subcutaneous tissue: Secondary | ICD-10-CM | POA: Diagnosis not present

## 2019-12-22 DIAGNOSIS — L570 Actinic keratosis: Secondary | ICD-10-CM | POA: Diagnosis not present

## 2019-12-22 DIAGNOSIS — D225 Melanocytic nevi of trunk: Secondary | ICD-10-CM | POA: Diagnosis not present

## 2019-12-22 DIAGNOSIS — L578 Other skin changes due to chronic exposure to nonionizing radiation: Secondary | ICD-10-CM | POA: Diagnosis not present

## 2020-03-07 DIAGNOSIS — R69 Illness, unspecified: Secondary | ICD-10-CM | POA: Diagnosis not present

## 2020-03-07 DIAGNOSIS — R2689 Other abnormalities of gait and mobility: Secondary | ICD-10-CM | POA: Diagnosis not present

## 2020-03-07 DIAGNOSIS — F419 Anxiety disorder, unspecified: Secondary | ICD-10-CM | POA: Diagnosis not present

## 2020-03-26 DIAGNOSIS — R2689 Other abnormalities of gait and mobility: Secondary | ICD-10-CM | POA: Diagnosis not present

## 2020-04-20 DIAGNOSIS — R2689 Other abnormalities of gait and mobility: Secondary | ICD-10-CM | POA: Diagnosis not present

## 2020-04-20 DIAGNOSIS — R2681 Unsteadiness on feet: Secondary | ICD-10-CM | POA: Diagnosis not present

## 2020-05-01 DIAGNOSIS — Z1159 Encounter for screening for other viral diseases: Secondary | ICD-10-CM | POA: Diagnosis not present

## 2020-05-01 DIAGNOSIS — Z0000 Encounter for general adult medical examination without abnormal findings: Secondary | ICD-10-CM | POA: Diagnosis not present

## 2020-05-01 DIAGNOSIS — Z1339 Encounter for screening examination for other mental health and behavioral disorders: Secondary | ICD-10-CM | POA: Diagnosis not present

## 2020-05-01 DIAGNOSIS — E2839 Other primary ovarian failure: Secondary | ICD-10-CM | POA: Diagnosis not present

## 2020-05-01 DIAGNOSIS — Z85048 Personal history of other malignant neoplasm of rectum, rectosigmoid junction, and anus: Secondary | ICD-10-CM | POA: Diagnosis not present

## 2020-05-01 DIAGNOSIS — R69 Illness, unspecified: Secondary | ICD-10-CM | POA: Diagnosis not present

## 2020-05-01 DIAGNOSIS — Z1331 Encounter for screening for depression: Secondary | ICD-10-CM | POA: Diagnosis not present

## 2020-05-01 DIAGNOSIS — Z6824 Body mass index (BMI) 24.0-24.9, adult: Secondary | ICD-10-CM | POA: Diagnosis not present

## 2020-05-02 ENCOUNTER — Other Ambulatory Visit: Payer: Self-pay | Admitting: Oncology

## 2020-05-02 DIAGNOSIS — C210 Malignant neoplasm of anus, unspecified: Secondary | ICD-10-CM

## 2020-05-04 ENCOUNTER — Inpatient Hospital Stay: Payer: Medicare HMO | Admitting: Oncology

## 2020-05-14 ENCOUNTER — Encounter (HOSPITAL_COMMUNITY): Payer: Self-pay

## 2020-05-14 ENCOUNTER — Other Ambulatory Visit: Payer: Self-pay

## 2020-05-14 ENCOUNTER — Ambulatory Visit (HOSPITAL_COMMUNITY)
Admission: RE | Admit: 2020-05-14 | Discharge: 2020-05-14 | Disposition: A | Discharge status: Home / Self Care | Payer: Medicare HMO | Source: Ambulatory Visit | Attending: Oncology | Admitting: Oncology

## 2020-05-14 DIAGNOSIS — Z85048 Personal history of other malignant neoplasm of rectum, rectosigmoid junction, and anus: Secondary | ICD-10-CM | POA: Diagnosis not present

## 2020-05-14 DIAGNOSIS — C210 Malignant neoplasm of anus, unspecified: Secondary | ICD-10-CM | POA: Insufficient documentation

## 2020-05-14 DIAGNOSIS — N858 Other specified noninflammatory disorders of uterus: Secondary | ICD-10-CM | POA: Diagnosis not present

## 2020-05-14 DIAGNOSIS — I700 Atherosclerosis of aorta: Secondary | ICD-10-CM | POA: Diagnosis not present

## 2020-05-14 DIAGNOSIS — K921 Melena: Secondary | ICD-10-CM | POA: Diagnosis not present

## 2020-05-14 HISTORY — DX: Malignant (primary) neoplasm, unspecified: C80.1

## 2020-05-14 LAB — POCT I-STAT CREATININE: Creatinine, Ser: 0.8 mg/dL (ref 0.44–1.00)

## 2020-05-14 MED ORDER — IOHEXOL 300 MG/ML  SOLN
100.0000 mL | Freq: Once | INTRAMUSCULAR | Status: AC | PRN
  Administered 2020-05-14: 100 mL via INTRAVENOUS

## 2020-05-21 NOTE — Progress Notes (Signed)
[facility]  [address] [place],  [place]  [address] [phone]  Clinic Day:  05/22/2020  Referring physician: Lind Guest, NP   HISTORY OF PRESENT ILLNESS:  The patient is a 69 y.o. 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  She completed all of her definitive chemoradiation in late February 2019, with her chemotherapy consisting of infusional 5-fluorouracil/mitomycin C.  She comes in today for routine followup, as well to review her most recent scans.  Since her last visit, the patient has been doing well.  She denies having anorectal bleeding or other findings which concern her for recurrent anal cancer being present.    PHYSICAL EXAM:  Blood pressure (!) 144/79, pulse 90, temperature 98.5 F (36.9 C), resp. rate 16, height 5' 2.75" (1.594 m), weight 143 lb 4.8 oz (65 kg), SpO2 95 %. Wt Readings from Last 3 Encounters:  05/22/20 143 lb 4.8 oz (65 kg)  12/09/17 140 lb (63.5 kg)   Body mass index is 25.59 kg/m. Performance status (ECOG): 0 Physical Exam Constitutional:      Appearance: Normal appearance. She is not ill-appearing.  HENT:     Mouth/Throat:     Mouth: Mucous membranes are moist.     Pharynx: Oropharynx is clear. No oropharyngeal exudate or posterior oropharyngeal erythema.  Cardiovascular:     Rate and Rhythm: Normal rate and regular rhythm.     Heart sounds: No murmur heard.  No friction rub. No gallop.   Pulmonary:     Effort: Pulmonary effort is normal. No respiratory distress.     Breath sounds: Normal breath sounds. No wheezing, rhonchi or rales.  Abdominal:     General: Bowel sounds are normal. There is no distension.     Palpations: Abdomen is soft. There is no mass.     Tenderness: There is no abdominal tenderness.  Genitourinary:    Rectum: Normal. No mass, anal fissure or external hemorrhoid. Normal anal tone.  Musculoskeletal:        General: No swelling.     Right lower leg: No edema.      Left lower leg: No edema.  Lymphadenopathy:     Cervical: No cervical adenopathy.     Upper Body:     Right upper body: No supraclavicular or axillary adenopathy.     Left upper body: No supraclavicular or axillary adenopathy.     Lower Body: No right inguinal adenopathy. No left inguinal adenopathy.  Skin:    General: Skin is warm.     Coloration: Skin is not jaundiced.     Findings: No lesion or rash.  Neurological:     General: No focal deficit present.     Mental Status: She is alert and oriented to person, place, and time. Mental status is at baseline.     Cranial Nerves: Cranial nerves are intact.  Psychiatric:        Mood and Affect: Mood normal.        Behavior: Behavior normal.        Thought Content: Thought content normal.     LABS:  No flowsheet data found. CMP Latest Ref Rng & Units 05/14/2020  Creatinine 0.44 - 1.00 mg/dL 0.80    STUDIES:  CT ABDOMEN PELVIS W CONTRAST  Result Date: 05/15/2020 CLINICAL DATA:  69 year old female with history of anal carcinoma diagnosed in 2018, status post chemotherapy and radiation therapy which is now complete. Intermittent blood in stools. Follow-up study.  EXAM: CT ABDOMEN AND PELVIS WITH CONTRAST TECHNIQUE: Multidetector CT imaging of the abdomen and pelvis was performed using the standard protocol following bolus administration of intravenous contrast. CONTRAST:  139mL OMNIPAQUE IOHEXOL 300 MG/ML  SOLN COMPARISON:  CT the abdomen and pelvis 05/11/2019. FINDINGS: Lower chest: Unremarkable. Hepatobiliary: No suspicious cystic or solid hepatic lesions. No intra or extrahepatic biliary ductal dilatation. Status post cholecystectomy. Pancreas: No pancreatic mass. No pancreatic ductal dilatation. No pancreatic or peripancreatic fluid collections or inflammatory changes. Spleen: Unremarkable. Adrenals/Urinary Tract: Bilateral kidneys and adrenal glands are normal in appearance. No hydroureteronephrosis. Urinary bladder is normal in  appearance. Stomach/Bowel: Normal appearance of the stomach. No pathologic dilatation of small bowel or colon. Normal appendix. Vascular/Lymphatic: Aortic atherosclerosis, without evidence of aneurysm or dissection in the abdominal or pelvic vasculature. 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 No lymphadenopathy noted in the abdomen or pelvis. Reproductive: Uterus and ovaries are atrophic. Other: No significant volume of ascites.  No pneumoperitoneum. Musculoskeletal: There are no aggressive appearing lytic or blastic lesions noted in the visualized portions of the skeleton. IMPRESSION: 1. Stable examination demonstrating no definite findings to suggest locally recurrent disease or metastatic disease in the abdomen or pelvis. 2. Aortic atherosclerosis. 3. Additional incidental findings, as above. Electronically Signed   By: Vinnie Langton M.D.   On: 05/15/2020 06:08      ASSESSMENT & PLAN:   Assessment/Plan:  A 69 y.o. female with stage IIA squamous cell anal cancer, status post completion of all of her definitive chemoradiation in February 2019.  Based upon her physical exam today and recent CT scans, the patient remains disease-free.  Clinically, the patient is doing very well.  I will see her back in another 6 months for repeat clinical assessment.  The patient understands all the plans discussed today and is in agreement with them.     Janiaya Ryser Macarthur Critchley, MD

## 2020-05-22 ENCOUNTER — Inpatient Hospital Stay: Payer: Medicare HMO | Attending: Oncology | Admitting: Oncology

## 2020-05-22 ENCOUNTER — Other Ambulatory Visit: Payer: Self-pay | Admitting: Oncology

## 2020-05-22 ENCOUNTER — Encounter: Payer: Self-pay | Admitting: Oncology

## 2020-05-22 ENCOUNTER — Other Ambulatory Visit: Payer: Self-pay

## 2020-05-22 VITALS — BP 144/79 | HR 90 | Temp 98.5°F | Resp 16 | Ht 62.75 in | Wt 143.3 lb

## 2020-05-22 DIAGNOSIS — C210 Malignant neoplasm of anus, unspecified: Secondary | ICD-10-CM

## 2020-06-06 DIAGNOSIS — N939 Abnormal uterine and vaginal bleeding, unspecified: Secondary | ICD-10-CM | POA: Diagnosis not present

## 2020-06-06 DIAGNOSIS — R69 Illness, unspecified: Secondary | ICD-10-CM | POA: Diagnosis not present

## 2020-06-06 DIAGNOSIS — G588 Other specified mononeuropathies: Secondary | ICD-10-CM | POA: Diagnosis not present

## 2020-06-06 DIAGNOSIS — Z79899 Other long term (current) drug therapy: Secondary | ICD-10-CM | POA: Diagnosis not present

## 2020-06-06 DIAGNOSIS — E785 Hyperlipidemia, unspecified: Secondary | ICD-10-CM | POA: Diagnosis not present

## 2020-06-13 DIAGNOSIS — N939 Abnormal uterine and vaginal bleeding, unspecified: Secondary | ICD-10-CM | POA: Diagnosis not present

## 2020-06-13 DIAGNOSIS — N950 Postmenopausal bleeding: Secondary | ICD-10-CM | POA: Diagnosis not present

## 2020-06-13 DIAGNOSIS — Z780 Asymptomatic menopausal state: Secondary | ICD-10-CM | POA: Diagnosis not present

## 2020-08-01 DIAGNOSIS — K5730 Diverticulosis of large intestine without perforation or abscess without bleeding: Secondary | ICD-10-CM | POA: Diagnosis not present

## 2020-08-01 DIAGNOSIS — C210 Malignant neoplasm of anus, unspecified: Secondary | ICD-10-CM | POA: Diagnosis not present

## 2020-08-17 DIAGNOSIS — Z1159 Encounter for screening for other viral diseases: Secondary | ICD-10-CM | POA: Diagnosis not present

## 2020-08-17 DIAGNOSIS — Z20828 Contact with and (suspected) exposure to other viral communicable diseases: Secondary | ICD-10-CM | POA: Diagnosis not present

## 2020-08-24 DIAGNOSIS — K5730 Diverticulosis of large intestine without perforation or abscess without bleeding: Secondary | ICD-10-CM | POA: Diagnosis not present

## 2020-08-24 DIAGNOSIS — Z85048 Personal history of other malignant neoplasm of rectum, rectosigmoid junction, and anus: Secondary | ICD-10-CM | POA: Diagnosis not present

## 2020-08-24 DIAGNOSIS — Z08 Encounter for follow-up examination after completed treatment for malignant neoplasm: Secondary | ICD-10-CM | POA: Diagnosis not present

## 2020-09-05 DIAGNOSIS — Z6824 Body mass index (BMI) 24.0-24.9, adult: Secondary | ICD-10-CM | POA: Diagnosis not present

## 2020-09-05 DIAGNOSIS — R5383 Other fatigue: Secondary | ICD-10-CM | POA: Diagnosis not present

## 2020-09-05 DIAGNOSIS — E7800 Pure hypercholesterolemia, unspecified: Secondary | ICD-10-CM | POA: Diagnosis not present

## 2020-09-05 DIAGNOSIS — R69 Illness, unspecified: Secondary | ICD-10-CM | POA: Diagnosis not present

## 2020-09-05 DIAGNOSIS — G319 Degenerative disease of nervous system, unspecified: Secondary | ICD-10-CM | POA: Diagnosis not present

## 2020-09-05 DIAGNOSIS — Z1331 Encounter for screening for depression: Secondary | ICD-10-CM | POA: Diagnosis not present

## 2020-09-05 DIAGNOSIS — Z85048 Personal history of other malignant neoplasm of rectum, rectosigmoid junction, and anus: Secondary | ICD-10-CM | POA: Diagnosis not present

## 2020-11-05 DIAGNOSIS — L309 Dermatitis, unspecified: Secondary | ICD-10-CM | POA: Diagnosis not present

## 2020-11-05 DIAGNOSIS — R69 Illness, unspecified: Secondary | ICD-10-CM | POA: Diagnosis not present

## 2020-11-13 DIAGNOSIS — C211 Malignant neoplasm of anal canal: Secondary | ICD-10-CM | POA: Insufficient documentation

## 2020-11-16 NOTE — Progress Notes (Signed)
[facility]  [address] [place],  [place]  [address] [phone]  Clinic Day:  11/20/2020  Referring physician: Haydee Salter, NP  This document serves as a record of services personally performed by Marice Potter, MD. It was created on their behalf by Curry,Lauren E, a trained medical scribe. The creation of this record is based on the scribe's personal observations and the provider's statements to them.  HISTORY OF PRESENT ILLNESS:  The patient is a 70 y.o. 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  She completed all of her definitive chemoradiation in late February 2019, with her chemotherapy consisting of infusional 5-fluorouracil/mitomycin C.  She comes in today for routine followup.  Since her last visit, the patient has been doing well.  She denies having anorectal bleeding or other findings which concern her for recurrent anal cancer being present.    PHYSICAL EXAM:  Blood pressure (!) 144/73, pulse 84, temperature 98.3 F (36.8 C), resp. rate 16, height 5' 2.75" (1.594 m), weight 141 lb 11.2 oz (64.3 kg), SpO2 94 %. Wt Readings from Last 3 Encounters:  11/20/20 141 lb 11.2 oz (64.3 kg)  05/22/20 143 lb 4.8 oz (65 kg)  12/09/17 140 lb (63.5 kg)   Body mass index is 25.3 kg/m. Performance status (ECOG): 0 Physical Exam Constitutional:      Appearance: Normal appearance. She is not ill-appearing.  HENT:     Mouth/Throat:     Mouth: Mucous membranes are moist.     Pharynx: Oropharynx is clear. No oropharyngeal exudate or posterior oropharyngeal erythema.  Cardiovascular:     Rate and Rhythm: Normal rate and regular rhythm.     Heart sounds: No murmur heard. No friction rub. No gallop.   Pulmonary:     Effort: Pulmonary effort is normal. No respiratory distress.     Breath sounds: Normal breath sounds. No wheezing, rhonchi or rales.  Chest:  Breasts:     Right: No axillary adenopathy or supraclavicular adenopathy.      Left: No axillary adenopathy or supraclavicular adenopathy.    Abdominal:     General: Bowel sounds are normal. There is no distension.     Palpations: Abdomen is soft. There is no mass.     Tenderness: There is no abdominal tenderness.     Comments: Normal anorectal examination  Genitourinary:    Rectum: Normal. No mass, anal fissure or external hemorrhoid. Normal anal tone.  Musculoskeletal:        General: No swelling.     Right lower leg: No edema.     Left lower leg: No edema.  Lymphadenopathy:     Cervical: No cervical adenopathy.     Upper Body:     Right upper body: No supraclavicular or axillary adenopathy.     Left upper body: No supraclavicular or axillary adenopathy.     Lower Body: No right inguinal adenopathy. No left inguinal adenopathy.  Skin:    General: Skin is warm.     Coloration: Skin is not jaundiced.     Findings: No lesion or rash.  Neurological:     General: No focal deficit present.     Mental Status: She is alert and oriented to person, place, and time. Mental status is at baseline.     Cranial Nerves: Cranial nerves are intact.  Psychiatric:        Mood and Affect: Mood normal.        Behavior: Behavior  normal.        Thought Content: Thought content normal.     LABS:   CBC Latest Ref Rng & Units 11/20/2020  WBC - 4.7  Hemoglobin 12.0 - 16.0 13.7  Hematocrit 36 - 46 40  Platelets 150 - 399 224   CMP Latest Ref Rng & Units 11/20/2020 05/14/2020  BUN 4 - 21 13 -  Creatinine 0.5 - 1.1 0.8 0.80  Sodium 137 - 147 134(A) -  Potassium 3.4 - 5.3 4.5 -  Chloride 99 - 108 103 -  CO2 13 - 22 26(A) -  Calcium 8.7 - 10.7 8.7 -  Alkaline Phos 25 - 125 59 -  AST 13 - 35 25 -  ALT 7 - 35 16 -    STUDIES:   DATE OF PROCEDURE: 08/24/20  PROCEDURE: Flexible Sigmoidoscopy  INDICATIONS:  History of anal cancer status post treatment.   Attending Provider: Rosemary Holms  IMPRESSION:  1. Normal flexible sigmoidoscopy to 45 cm with  mild radiation changes of the rectum and anal canal .  ASSESSMENT & PLAN:   Assessment/Plan:  A 70 y.o. female with stage IIA squamous cell anal cancer, status post completion of all of her definitive chemoradiation in February 2019.  Based upon her physical exam today and recent flexible sigmoidoscopy, the patient remains disease-free.  Clinically, the patient is doing very well.  I will see her back in another 6 months for repeat clinical assessment.   CT scans will be done a day before her next visit to ascertain her new disease baseline.  The patient understands all the plans discussed today and is in agreement with them.     I, Rita Ohara, am acting as scribe for Marice Potter, MD    I have reviewed this report as typed by the medical scribe, and it is complete and accurate.  Dequincy Macarthur Critchley, MD

## 2020-11-20 ENCOUNTER — Encounter: Payer: Self-pay | Admitting: Oncology

## 2020-11-20 ENCOUNTER — Other Ambulatory Visit: Payer: Self-pay | Admitting: Oncology

## 2020-11-20 ENCOUNTER — Inpatient Hospital Stay: Payer: Medicare HMO

## 2020-11-20 ENCOUNTER — Telehealth: Payer: Self-pay | Admitting: Oncology

## 2020-11-20 ENCOUNTER — Inpatient Hospital Stay: Payer: Medicare HMO | Attending: Oncology | Admitting: Oncology

## 2020-11-20 ENCOUNTER — Other Ambulatory Visit: Payer: Self-pay

## 2020-11-20 DIAGNOSIS — C211 Malignant neoplasm of anal canal: Secondary | ICD-10-CM

## 2020-11-20 DIAGNOSIS — C210 Malignant neoplasm of anus, unspecified: Secondary | ICD-10-CM

## 2020-11-20 DIAGNOSIS — D649 Anemia, unspecified: Secondary | ICD-10-CM | POA: Diagnosis not present

## 2020-11-20 LAB — BASIC METABOLIC PANEL
BUN: 13 (ref 4–21)
CO2: 26 — AB (ref 13–22)
Chloride: 103 (ref 99–108)
Creatinine: 0.8 (ref 0.5–1.1)
Glucose: 83
Potassium: 4.5 (ref 3.4–5.3)
Sodium: 134 — AB (ref 137–147)

## 2020-11-20 LAB — COMPREHENSIVE METABOLIC PANEL
Albumin: 4.2 (ref 3.5–5.0)
Calcium: 8.7 (ref 8.7–10.7)

## 2020-11-20 LAB — CBC AND DIFFERENTIAL
HCT: 40 (ref 36–46)
Hemoglobin: 13.7 (ref 12.0–16.0)
Neutrophils Absolute: 2.49
Platelets: 224 (ref 150–399)
WBC: 4.7

## 2020-11-20 LAB — HEPATIC FUNCTION PANEL
ALT: 16 (ref 7–35)
AST: 25 (ref 13–35)
Alkaline Phosphatase: 59 (ref 25–125)
Bilirubin, Total: 0.4

## 2020-11-20 LAB — CBC: RBC: 3.93 (ref 3.87–5.11)

## 2020-11-20 NOTE — Telephone Encounter (Signed)
Per 5/31 los next appt scheduled and given to patient 

## 2020-11-27 DIAGNOSIS — C44311 Basal cell carcinoma of skin of nose: Secondary | ICD-10-CM | POA: Diagnosis not present

## 2020-11-27 DIAGNOSIS — L570 Actinic keratosis: Secondary | ICD-10-CM | POA: Diagnosis not present

## 2020-11-27 DIAGNOSIS — L821 Other seborrheic keratosis: Secondary | ICD-10-CM | POA: Diagnosis not present

## 2020-11-27 DIAGNOSIS — L578 Other skin changes due to chronic exposure to nonionizing radiation: Secondary | ICD-10-CM | POA: Diagnosis not present

## 2021-02-01 IMAGING — CT CT ABD-PELV W/ CM
2 of 5 series · 15 of 46 positions shown, 17 images · IV contrast (OMNIPAQUE)
Comparison: CT the abdomen and pelvis 05/11/2019.

CLINICAL DATA: 69-year-old female with history of anal carcinoma
diagnosed in 4525, status post chemotherapy and radiation therapy
which is now complete. Intermittent blood in stools. Follow-up
study.

EXAM:
CT ABDOMEN AND PELVIS WITH CONTRAST
TECHNIQUE: Multidetector CT imaging of the abdomen and pelvis was performed
using the standard protocol following bolus administration of
intravenous contrast.
CONTRAST:  100mL OMNIPAQUE IOHEXOL 300 MG/ML  SOLN

[Series 2: axial st · axial · 0.74mm/px · z∈[-468,-63]mm · 12 of 95 slices shown, 14 images]
[im 7/95  soft-tissue]
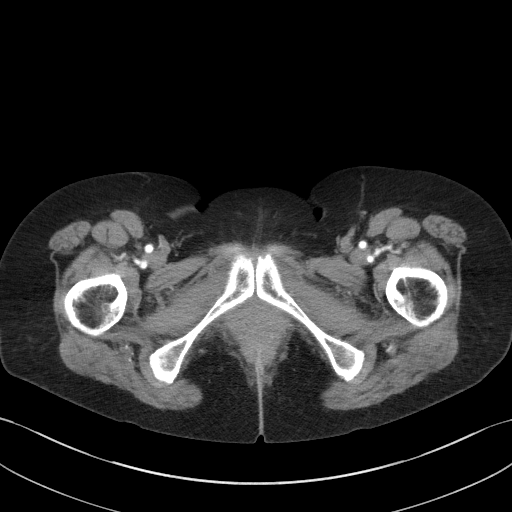
[im 7/95  bone]
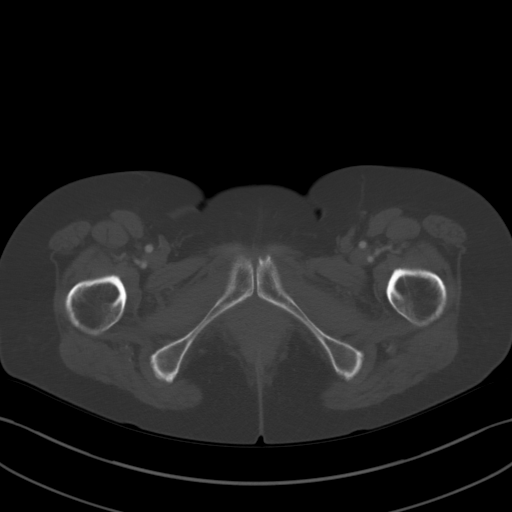
[im 13/95  soft-tissue]
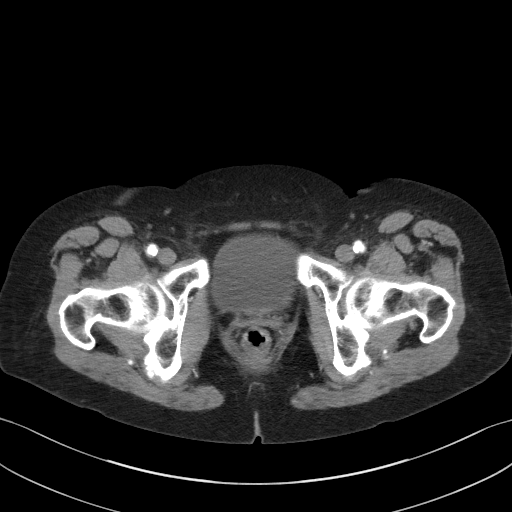
[im 19/95  soft-tissue]
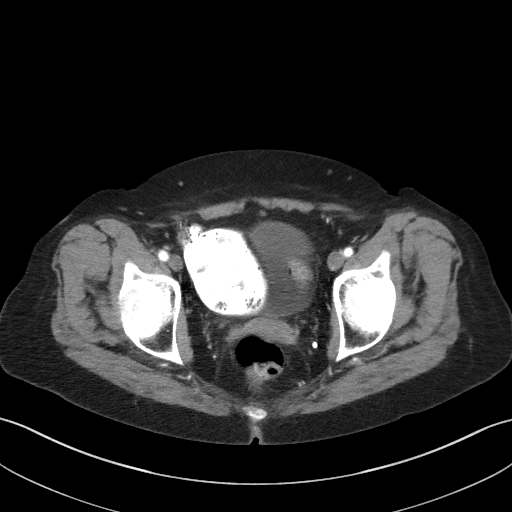
[im 32/95  soft-tissue]
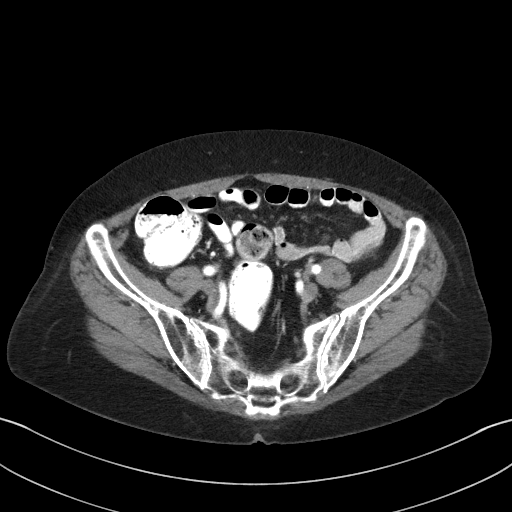
[im 38/95  soft-tissue]
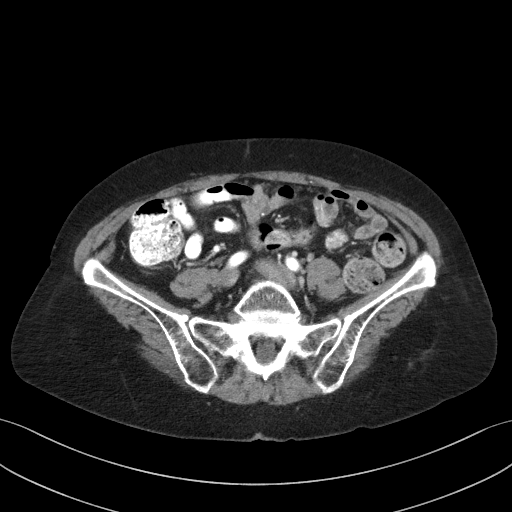
[im 44/95  soft-tissue]
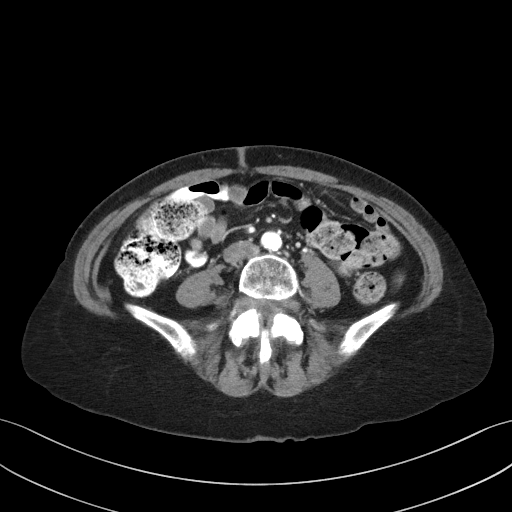
[im 51/95  soft-tissue]
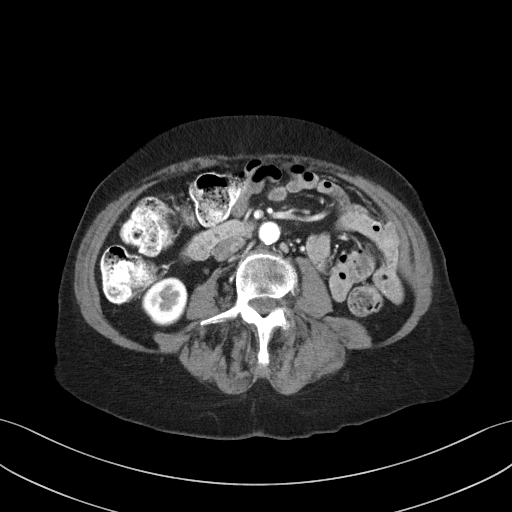
[im 57/95  soft-tissue]
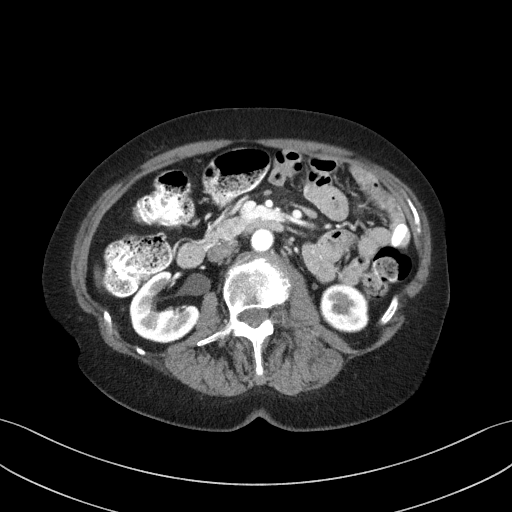
[im 63/95  soft-tissue]
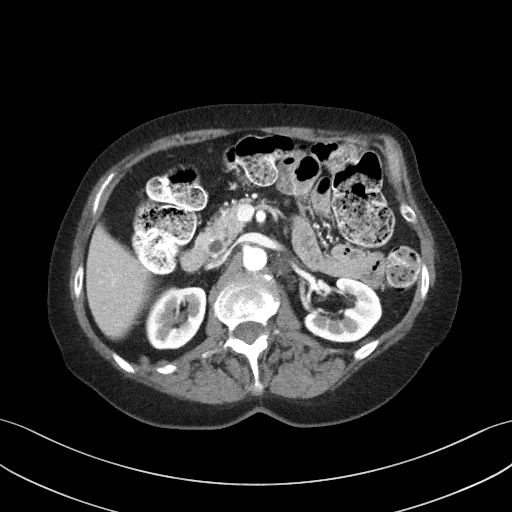
[im 63/95  bone]
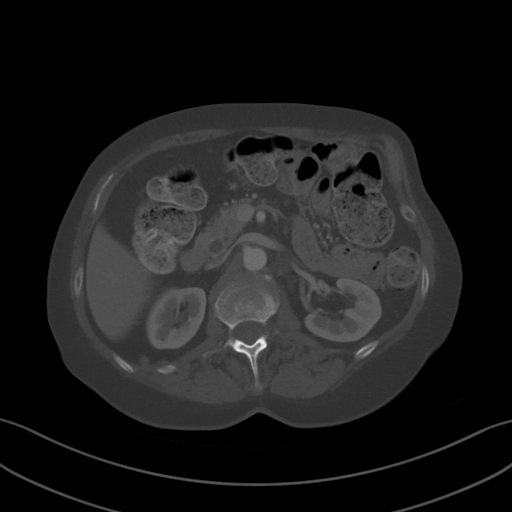
[im 76/95  soft-tissue]
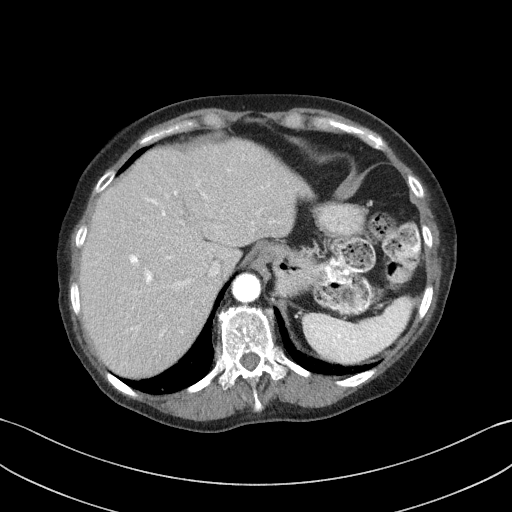
[im 82/95  soft-tissue]
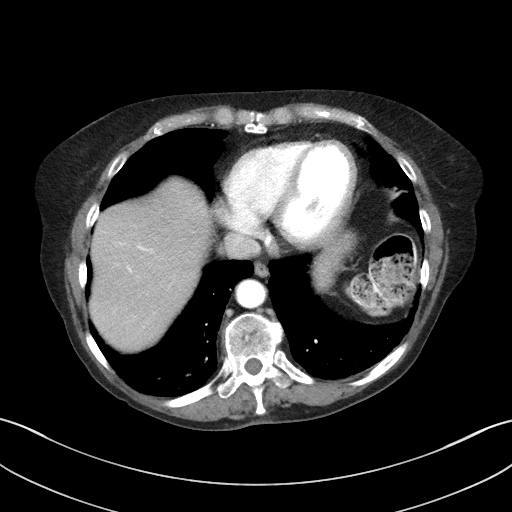
[im 88/95  soft-tissue]
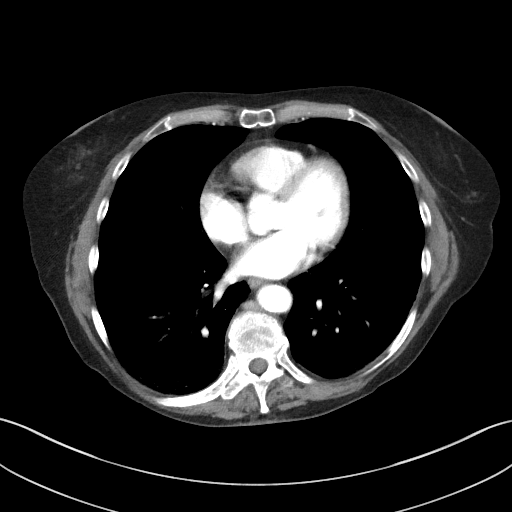

[Series 4: coronal st · coronal · 0.68mm/px · 3 of 85 slices shown]
[im 29/85  soft-tissue]
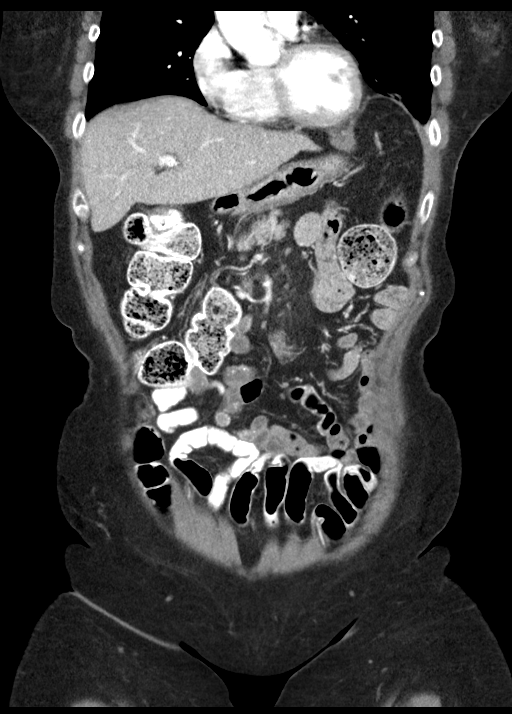
[im 38/85  soft-tissue]
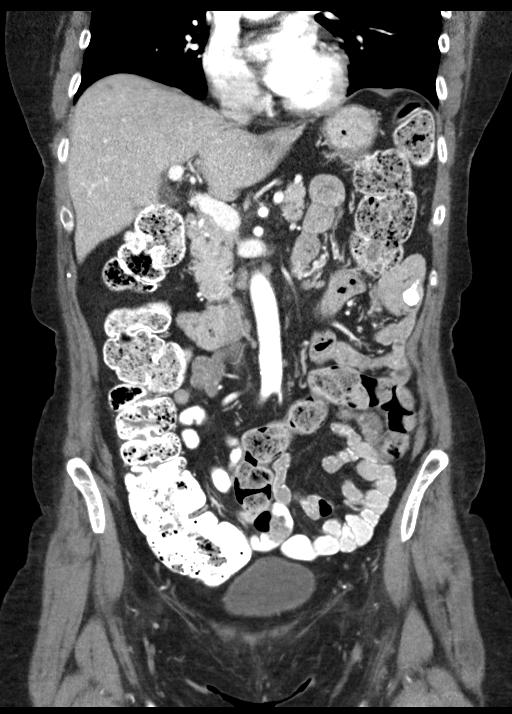
[im 47/85  soft-tissue]
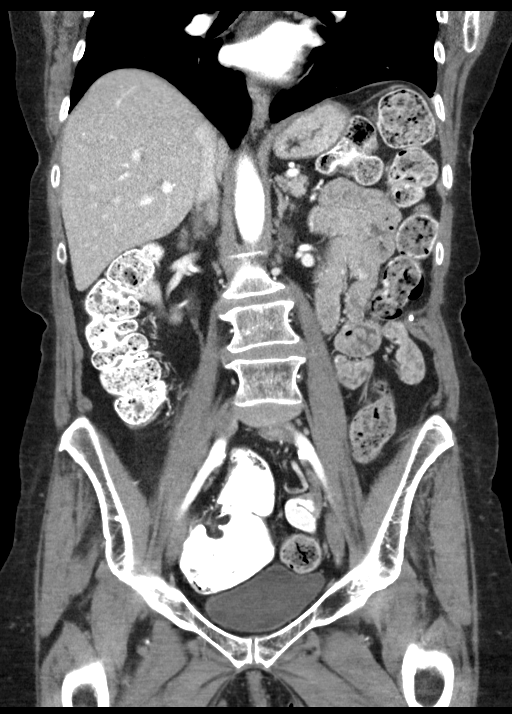

[15 of 46 positions shown; findings below may reference images not displayed]

FINDINGS: Lower chest: Unremarkable.

Hepatobiliary: No suspicious cystic or solid hepatic lesions. No
intra or extrahepatic biliary ductal dilatation. Status post
cholecystectomy.

Pancreas: No pancreatic mass. No pancreatic ductal dilatation. No
pancreatic or peripancreatic fluid collections or inflammatory
changes.

Spleen: Unremarkable.

Adrenals/Urinary Tract: Bilateral kidneys and adrenal glands are
normal in appearance. No hydroureteronephrosis. Urinary bladder is
normal in appearance.

Stomach/Bowel: Normal appearance of the stomach. No pathologic
dilatation of small bowel or colon. Normal appendix.

Vascular/Lymphatic: Aortic atherosclerosis, without evidence of
aneurysm or dissection in the abdominal or pelvic vasculature. Small
2.4 x 1.4 cm low-attenuation lesion in the left retroperitoneum
immediately medial to the left kidney (axial image 31 of series 2),
stable compared to numerous prior examinations, most compatible with
a benign lesions such as a small lymphangioma. No lymphadenopathy
noted in the abdomen or pelvis.

Reproductive: Uterus and ovaries are atrophic.

Other: No significant volume of ascites.  No pneumoperitoneum.

Musculoskeletal: There are no aggressive appearing lytic or blastic
lesions noted in the visualized portions of the skeleton.
IMPRESSION: 1. Stable examination demonstrating no definite findings to suggest
locally recurrent disease or metastatic disease in the abdomen or
pelvis.
2. Aortic atherosclerosis.
3. Additional incidental findings, as above.

## 2021-02-04 DIAGNOSIS — J029 Acute pharyngitis, unspecified: Secondary | ICD-10-CM | POA: Diagnosis not present

## 2021-02-04 DIAGNOSIS — R5383 Other fatigue: Secondary | ICD-10-CM | POA: Diagnosis not present

## 2021-02-04 DIAGNOSIS — J0100 Acute maxillary sinusitis, unspecified: Secondary | ICD-10-CM | POA: Diagnosis not present

## 2021-02-04 DIAGNOSIS — Z20828 Contact with and (suspected) exposure to other viral communicable diseases: Secondary | ICD-10-CM | POA: Diagnosis not present

## 2021-02-04 DIAGNOSIS — R519 Headache, unspecified: Secondary | ICD-10-CM | POA: Diagnosis not present

## 2021-03-08 DIAGNOSIS — R69 Illness, unspecified: Secondary | ICD-10-CM | POA: Diagnosis not present

## 2021-03-08 DIAGNOSIS — E7800 Pure hypercholesterolemia, unspecified: Secondary | ICD-10-CM | POA: Diagnosis not present

## 2021-05-03 DIAGNOSIS — Z23 Encounter for immunization: Secondary | ICD-10-CM | POA: Diagnosis not present

## 2021-05-03 DIAGNOSIS — E7800 Pure hypercholesterolemia, unspecified: Secondary | ICD-10-CM | POA: Diagnosis not present

## 2021-05-03 DIAGNOSIS — Z0001 Encounter for general adult medical examination with abnormal findings: Secondary | ICD-10-CM | POA: Diagnosis not present

## 2021-05-03 DIAGNOSIS — E2839 Other primary ovarian failure: Secondary | ICD-10-CM | POA: Diagnosis not present

## 2021-05-03 DIAGNOSIS — F320 Major depressive disorder, single episode, mild: Secondary | ICD-10-CM | POA: Diagnosis not present

## 2021-05-03 DIAGNOSIS — F419 Anxiety disorder, unspecified: Secondary | ICD-10-CM | POA: Diagnosis not present

## 2021-05-03 DIAGNOSIS — Z1339 Encounter for screening examination for other mental health and behavioral disorders: Secondary | ICD-10-CM | POA: Diagnosis not present

## 2021-05-03 DIAGNOSIS — R69 Illness, unspecified: Secondary | ICD-10-CM | POA: Diagnosis not present

## 2021-05-03 DIAGNOSIS — Z1231 Encounter for screening mammogram for malignant neoplasm of breast: Secondary | ICD-10-CM | POA: Diagnosis not present

## 2021-05-03 DIAGNOSIS — Z1331 Encounter for screening for depression: Secondary | ICD-10-CM | POA: Diagnosis not present

## 2021-05-13 NOTE — Progress Notes (Incomplete)
[facility]  [address] [place],  [place]  [address] [phone]  Clinic Day:  05/13/2021  Referring physician: Haydee Salter, NP  This document serves as a record of services personally performed by Marice Potter, MD. It was created on their behalf by Curry,Lauren E, a trained medical scribe. The creation of this record is based on the scribe's personal observations and the provider's statements to them.  HISTORY OF PRESENT ILLNESS:  The patient is a 70 y.o. 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  She completed all of her definitive chemoradiation in late February 2019, with her chemotherapy consisting of infusional 5-fluorouracil/mitomycin C.  She comes in today for routine followup.  Since her last visit, the patient has been doing well.  She denies having anorectal bleeding or other findings which concern her for recurrent anal cancer being present.    PHYSICAL EXAM:  There were no vitals taken for this visit. Wt Readings from Last 3 Encounters:  11/20/20 141 lb 11.2 oz (64.3 kg)  05/22/20 143 lb 4.8 oz (65 kg)  12/09/17 140 lb (63.5 kg)   There is no height or weight on file to calculate BMI. Performance status (ECOG): 0 Physical Exam Constitutional:      Appearance: Normal appearance. She is not ill-appearing.  HENT:     Mouth/Throat:     Mouth: Mucous membranes are moist.     Pharynx: Oropharynx is clear. No oropharyngeal exudate or posterior oropharyngeal erythema.  Cardiovascular:     Rate and Rhythm: Normal rate and regular rhythm.     Heart sounds: No murmur heard.   No friction rub. No gallop.  Pulmonary:     Effort: Pulmonary effort is normal. No respiratory distress.     Breath sounds: Normal breath sounds. No wheezing, rhonchi or rales.  Abdominal:     General: Bowel sounds are normal. There is no distension.     Palpations: Abdomen is soft. There is no mass.     Tenderness: There is no abdominal  tenderness.     Comments: Normal anorectal examination  Genitourinary:    Rectum: Normal. No mass, anal fissure or external hemorrhoid. Normal anal tone.  Musculoskeletal:        General: No swelling.     Right lower leg: No edema.     Left lower leg: No edema.  Lymphadenopathy:     Cervical: No cervical adenopathy.     Upper Body:     Right upper body: No supraclavicular or axillary adenopathy.     Left upper body: No supraclavicular or axillary adenopathy.     Lower Body: No right inguinal adenopathy. No left inguinal adenopathy.  Skin:    General: Skin is warm.     Coloration: Skin is not jaundiced.     Findings: No lesion or rash.  Neurological:     General: No focal deficit present.     Mental Status: She is alert and oriented to person, place, and time. Mental status is at baseline.  Psychiatric:        Mood and Affect: Mood normal.        Behavior: Behavior normal.        Thought Content: Thought content normal.   SCANS: Recent CT imaging has revealed the following: ***  LABS:   CBC Latest Ref Rng & Units 11/20/2020  WBC - 4.7  Hemoglobin 12.0 - 16.0 13.7  Hematocrit 36 - 46 40  Platelets  150 - 399 224   CMP Latest Ref Rng & Units 11/20/2020 05/14/2020  BUN 4 - 21 13 -  Creatinine 0.5 - 1.1 0.8 0.80  Sodium 137 - 147 134(A) -  Potassium 3.4 - 5.3 4.5 -  Chloride 99 - 108 103 -  CO2 13 - 22 26(A) -  Calcium 8.7 - 10.7 8.7 -  Alkaline Phos 25 - 125 59 -  AST 13 - 35 25 -  ALT 7 - 35 16 -    STUDIES:    ASSESSMENT & PLAN:   Assessment/Plan:  A 70 y.o. female with stage IIA squamous cell anal cancer, status post completion of all of her definitive chemoradiation in February 2019.  Based upon her physical exam today, the patient remains disease-free.  Clinically, the patient is doing very well.  I will see her back in another 6 months for repeat clinical assessment.   CT scans will be done a day before her next visit to ascertain her new disease baseline.  The  patient understands all the plans discussed today and is in agreement with them.     I, Rita Ohara, am acting as scribe for Marice Potter, MD    I have reviewed this report as typed by the medical scribe, and it is complete and accurate.  Dequincy Macarthur Critchley, MD

## 2021-05-20 ENCOUNTER — Other Ambulatory Visit: Payer: Medicare HMO

## 2021-05-22 ENCOUNTER — Ambulatory Visit: Payer: Medicare HMO | Admitting: Oncology

## 2021-05-23 ENCOUNTER — Encounter: Payer: Self-pay | Admitting: Oncology

## 2021-05-23 DIAGNOSIS — C211 Malignant neoplasm of anal canal: Secondary | ICD-10-CM | POA: Diagnosis not present

## 2021-05-23 DIAGNOSIS — I700 Atherosclerosis of aorta: Secondary | ICD-10-CM | POA: Diagnosis not present

## 2021-05-23 DIAGNOSIS — J439 Emphysema, unspecified: Secondary | ICD-10-CM | POA: Diagnosis not present

## 2021-05-23 DIAGNOSIS — J9811 Atelectasis: Secondary | ICD-10-CM | POA: Diagnosis not present

## 2021-05-23 DIAGNOSIS — K6389 Other specified diseases of intestine: Secondary | ICD-10-CM | POA: Diagnosis not present

## 2021-05-23 DIAGNOSIS — C20 Malignant neoplasm of rectum: Secondary | ICD-10-CM | POA: Diagnosis not present

## 2021-05-23 DIAGNOSIS — K3189 Other diseases of stomach and duodenum: Secondary | ICD-10-CM | POA: Diagnosis not present

## 2021-05-23 LAB — COMPREHENSIVE METABOLIC PANEL
Albumin: 4.4 (ref 3.5–5.0)
Calcium: 9.4 (ref 8.7–10.7)

## 2021-05-23 LAB — HEPATIC FUNCTION PANEL
ALT: 18 (ref 7–35)
AST: 29 (ref 13–35)
Alkaline Phosphatase: 60 (ref 25–125)
Bilirubin, Total: 0.6

## 2021-05-23 LAB — CBC AND DIFFERENTIAL
HCT: 41 (ref 36–46)
Hemoglobin: 13.8 (ref 12.0–16.0)
Neutrophils Absolute: 2.74
Platelets: 243 (ref 150–399)
WBC: 4.8

## 2021-05-23 LAB — BASIC METABOLIC PANEL
BUN: 12 (ref 4–21)
CO2: 29 — AB (ref 13–22)
Chloride: 99 (ref 99–108)
Creatinine: 0.8 (ref 0.5–1.1)
Glucose: 89
Potassium: 3.9 (ref 3.4–5.3)
Sodium: 135 — AB (ref 137–147)

## 2021-05-23 LAB — CBC: RBC: 4.1 (ref 3.87–5.11)

## 2021-05-23 NOTE — Progress Notes (Signed)
[facility]  [address] [place],  [place]  [address] [phone]  Clinic Day:  05/24/2021  Referring physician: Haydee Salter, NP  This document serves as a record of services personally performed by Marice Potter, MD. It was created on their behalf by Curry,Lauren E, a trained medical scribe. The creation of this record is based on the scribe's personal observations and the provider's statements to them.  HISTORY OF PRESENT ILLNESS:  The patient is a 70 y.o. 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  She completed all of her definitive chemoradiation in late February 2019, with her chemotherapy consisting of infusional 5-fluorouracil/mitomycin C.  She comes in today for routine follow-up, as well as to review her most recent CT scans.  Since her last visit, the patient has been doing well.  She denies having anorectal bleeding or other findings which concern her for recurrent anal cancer being present.    PHYSICAL EXAM:  Blood pressure (!) 157/77, pulse 78, temperature 98 F (36.7 C), resp. rate 14, height 5' 2.75" (1.594 m), weight 143 lb 3.2 oz (65 kg), SpO2 98 %. Wt Readings from Last 3 Encounters:  05/24/21 143 lb 3.2 oz (65 kg)  11/20/20 141 lb 11.2 oz (64.3 kg)  05/22/20 143 lb 4.8 oz (65 kg)   Body mass index is 25.57 kg/m. Performance status (ECOG): 0 Physical Exam Constitutional:      Appearance: Normal appearance. She is not ill-appearing.  HENT:     Mouth/Throat:     Mouth: Mucous membranes are moist.     Pharynx: Oropharynx is clear. No oropharyngeal exudate or posterior oropharyngeal erythema.  Cardiovascular:     Rate and Rhythm: Normal rate and regular rhythm.     Heart sounds: No murmur heard.   No friction rub. No gallop.  Pulmonary:     Effort: Pulmonary effort is normal. No respiratory distress.     Breath sounds: Normal breath sounds. No wheezing, rhonchi or rales.  Abdominal:     General: Bowel  sounds are normal. There is no distension.     Palpations: Abdomen is soft. There is no mass.     Tenderness: There is no abdominal tenderness.     Comments: Normal anorectal examination  Genitourinary:    Rectum: Normal. No mass, anal fissure or external hemorrhoid. Normal anal tone.  Musculoskeletal:        General: No swelling.     Right lower leg: No edema.     Left lower leg: No edema.  Lymphadenopathy:     Cervical: No cervical adenopathy.     Upper Body:     Right upper body: No supraclavicular or axillary adenopathy.     Left upper body: No supraclavicular or axillary adenopathy.     Lower Body: No right inguinal adenopathy. No left inguinal adenopathy.  Skin:    General: Skin is warm.     Coloration: Skin is not jaundiced.     Findings: No lesion or rash.  Neurological:     General: No focal deficit present.     Mental Status: She is alert and oriented to person, place, and time. Mental status is at baseline.  Psychiatric:        Mood and Affect: Mood normal.        Behavior: Behavior normal.        Thought Content: Thought content normal.   SCANS: CT scans of her chest/abdomen/pelvis showed no signs of  any disease recurrence.  LABS:   CBC Latest Ref Rng & Units 05/23/2021 11/20/2020  WBC - 4.8 4.7  Hemoglobin 12.0 - 16.0 13.8 13.7  Hematocrit 36 - 46 41 40  Platelets 150 - 399 243 224   CMP Latest Ref Rng & Units 05/23/2021 11/20/2020 05/14/2020  BUN 4 - 21 12 13  -  Creatinine 0.5 - 1.1 0.8 0.8 0.80  Sodium 137 - 147 135(A) 134(A) -  Potassium 3.4 - 5.3 3.9 4.5 -  Chloride 99 - 108 99 103 -  CO2 13 - 22 29(A) 26(A) -  Calcium 8.7 - 10.7 9.4 8.7 -  Alkaline Phos 25 - 125 60 59 -  AST 13 - 35 29 25 -  ALT 7 - 35 18 16 -   ASSESSMENT & PLAN:  Assessment/Plan:  A 70 y.o. female with stage IIA squamous cell anal cancer, status post completion of all of her definitive chemoradiation in February 2019 we will be based upon her physical exam today and CT scans done  yesterday, the patient remains disease-free.  Clinically, the patient is doing very well.  I will see her back in another 6 months for repeat clinical assessment.  The patient understands all the plans discussed today and is in agreement with them.     I, Rita Ohara, am acting as scribe for Marice Potter, MD    I have reviewed this report as typed by the medical scribe, and it is complete and accurate.  Genavieve Mangiapane Macarthur Critchley, MD

## 2021-05-24 ENCOUNTER — Inpatient Hospital Stay: Payer: Medicare HMO | Attending: Oncology | Admitting: Oncology

## 2021-05-24 ENCOUNTER — Telehealth: Payer: Self-pay | Admitting: Oncology

## 2021-05-24 VITALS — BP 157/77 | HR 78 | Temp 98.0°F | Resp 14 | Ht 62.75 in | Wt 143.2 lb

## 2021-05-24 DIAGNOSIS — C211 Malignant neoplasm of anal canal: Secondary | ICD-10-CM | POA: Diagnosis not present

## 2021-05-24 NOTE — Telephone Encounter (Signed)
Per 12/2 los next appt scheduled and given to patient

## 2021-06-19 ENCOUNTER — Encounter: Payer: Self-pay | Admitting: Oncology

## 2021-07-19 DIAGNOSIS — U099 Post covid-19 condition, unspecified: Secondary | ICD-10-CM | POA: Diagnosis not present

## 2021-07-19 DIAGNOSIS — R051 Acute cough: Secondary | ICD-10-CM | POA: Diagnosis not present

## 2021-07-19 DIAGNOSIS — J3489 Other specified disorders of nose and nasal sinuses: Secondary | ICD-10-CM | POA: Diagnosis not present

## 2021-07-19 DIAGNOSIS — J040 Acute laryngitis: Secondary | ICD-10-CM | POA: Diagnosis not present

## 2021-08-27 DIAGNOSIS — Z6824 Body mass index (BMI) 24.0-24.9, adult: Secondary | ICD-10-CM | POA: Diagnosis not present

## 2021-08-27 DIAGNOSIS — R42 Dizziness and giddiness: Secondary | ICD-10-CM | POA: Diagnosis not present

## 2021-09-02 DIAGNOSIS — R42 Dizziness and giddiness: Secondary | ICD-10-CM | POA: Diagnosis not present

## 2021-10-07 DIAGNOSIS — K5790 Diverticulosis of intestine, part unspecified, without perforation or abscess without bleeding: Secondary | ICD-10-CM | POA: Diagnosis not present

## 2021-10-07 DIAGNOSIS — K5900 Constipation, unspecified: Secondary | ICD-10-CM | POA: Diagnosis not present

## 2021-10-31 DIAGNOSIS — Z79899 Other long term (current) drug therapy: Secondary | ICD-10-CM | POA: Diagnosis not present

## 2021-10-31 DIAGNOSIS — R69 Illness, unspecified: Secondary | ICD-10-CM | POA: Diagnosis not present

## 2021-10-31 DIAGNOSIS — E7800 Pure hypercholesterolemia, unspecified: Secondary | ICD-10-CM | POA: Diagnosis not present

## 2021-10-31 DIAGNOSIS — Z6824 Body mass index (BMI) 24.0-24.9, adult: Secondary | ICD-10-CM | POA: Diagnosis not present

## 2021-11-21 NOTE — Progress Notes (Signed)
[facility]  [address] [place],  [place]  [address] [phone]  Clinic Day:  11/22/2021  Referring physician: Haydee Salter, NP  HISTORY OF PRESENT ILLNESS:  The patient is a 71 y.o. 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  She completed all of her definitive chemoradiation in late February 2019, with her chemotherapy consisting of infusional 5-fluorouracil/mitomycin C.  She comes in today for routine follow-up.  Since her last visit, the patient has been doing well.  Other than rare bleeding with hard stools, she denies having other findings which concern her for recurrent anal cancer being present.    PHYSICAL EXAM:  Blood pressure 136/87, pulse 90, temperature 97.6 F (36.4 C), temperature source Oral, resp. rate 16, height 5' 2.75" (1.594 m), weight 142 lb 6.4 oz (64.6 kg), SpO2 98 %. Wt Readings from Last 3 Encounters:  11/22/21 142 lb 6.4 oz (64.6 kg)  05/24/21 143 lb 3.2 oz (65 kg)  11/20/20 141 lb 11.2 oz (64.3 kg)   Body mass index is 25.43 kg/m. Performance status (ECOG): 0 Physical Exam Constitutional:      Appearance: Normal appearance. She is not ill-appearing.  HENT:     Mouth/Throat:     Mouth: Mucous membranes are moist.     Pharynx: Oropharynx is clear. No oropharyngeal exudate or posterior oropharyngeal erythema.  Cardiovascular:     Rate and Rhythm: Normal rate and regular rhythm.     Heart sounds: No murmur heard.   No friction rub. No gallop.  Pulmonary:     Effort: Pulmonary effort is normal. No respiratory distress.     Breath sounds: Normal breath sounds. No wheezing, rhonchi or rales.  Abdominal:     General: Bowel sounds are normal. There is no distension.     Palpations: Abdomen is soft. There is no mass.     Tenderness: There is no abdominal tenderness.     Comments: Normal anorectal examination  Genitourinary:    Rectum: Normal. No mass, anal fissure or external hemorrhoid. Normal anal  tone.  Musculoskeletal:        General: No swelling.     Right lower leg: No edema.     Left lower leg: No edema.  Lymphadenopathy:     Cervical: No cervical adenopathy.     Upper Body:     Right upper body: No supraclavicular or axillary adenopathy.     Left upper body: No supraclavicular or axillary adenopathy.     Lower Body: No right inguinal adenopathy. No left inguinal adenopathy.  Skin:    General: Skin is warm.     Coloration: Skin is not jaundiced.     Findings: No lesion or rash.  Neurological:     General: No focal deficit present.     Mental Status: She is alert and oriented to person, place, and time. Mental status is at baseline.  Psychiatric:        Mood and Affect: Mood normal.        Behavior: Behavior normal.        Thought Content: Thought content normal.   LABS:      Latest Ref Rng & Units 11/22/2021   12:00 AM 05/23/2021   12:00 AM 11/20/2020   12:00 AM  CBC  WBC  4.3      4.8   4.7    Hemoglobin 12.0 - 16.0 13.4      13.8   13.7  Hematocrit 36 - 46 40      41   40    Platelets 150 - 400 K/uL 227      243   224       This result is from an external source.      Latest Ref Rng & Units 11/22/2021   12:00 AM 05/23/2021   12:00 AM 11/20/2020   12:00 AM  CMP  BUN 4 - '21 17      12   13    '$ Creatinine 0.5 - 1.1 0.7      0.8   0.8    Sodium 137 - 147 139      135   134    Potassium 3.5 - 5.1 mEq/L 4.4      3.9   4.5    Chloride 99 - 108 105      99   103    CO2 13 - '22 25      29   26    '$ Calcium 8.7 - 10.7 9.0      9.4   8.7    Alkaline Phos 25 - 125 48      60   59    AST 13 - 35 '26      29   25    '$ ALT 7 - 35 U/L '17      18   16       '$ This result is from an external source.   ASSESSMENT & PLAN:  Assessment/Plan:  A 71 y.o. female with stage IIA squamous cell anal cancer, status post completion of all of her definitive chemoradiation in February 2019.  Based upon her physical exam today, the patient remains disease-free.  Clinically, the patient is  doing very well.  I will see her back in February 2024 for repeat clinical assessment.  If her next exam is normal, it will mark her being 5 years cancer free for which I would consider her cured of disease.  The patient understands all the plans discussed today and is in agreement with them.    Ashari Llewellyn Macarthur Critchley, MD

## 2021-11-22 ENCOUNTER — Other Ambulatory Visit: Payer: Self-pay | Admitting: Oncology

## 2021-11-22 ENCOUNTER — Other Ambulatory Visit: Payer: Self-pay | Admitting: Hematology and Oncology

## 2021-11-22 ENCOUNTER — Inpatient Hospital Stay: Payer: Medicare HMO | Attending: Oncology | Admitting: Oncology

## 2021-11-22 ENCOUNTER — Encounter: Payer: Self-pay | Admitting: Oncology

## 2021-11-22 ENCOUNTER — Inpatient Hospital Stay: Payer: Medicare HMO

## 2021-11-22 VITALS — BP 136/87 | HR 90 | Temp 97.6°F | Resp 16 | Ht 62.75 in | Wt 142.4 lb

## 2021-11-22 DIAGNOSIS — C211 Malignant neoplasm of anal canal: Secondary | ICD-10-CM | POA: Diagnosis not present

## 2021-11-22 LAB — BASIC METABOLIC PANEL
BUN: 17 (ref 4–21)
CO2: 25 — AB (ref 13–22)
Chloride: 105 (ref 99–108)
Creatinine: 0.7 (ref 0.5–1.1)
Glucose: 93
Potassium: 4.4 mEq/L (ref 3.5–5.1)
Sodium: 139 (ref 137–147)

## 2021-11-22 LAB — HEPATIC FUNCTION PANEL
ALT: 17 U/L (ref 7–35)
AST: 26 (ref 13–35)
Alkaline Phosphatase: 48 (ref 25–125)
Bilirubin, Total: 0.5

## 2021-11-22 LAB — CBC AND DIFFERENTIAL
HCT: 40 (ref 36–46)
Hemoglobin: 13.4 (ref 12.0–16.0)
Neutrophils Absolute: 2.32
Platelets: 227 10*3/uL (ref 150–400)
WBC: 4.3

## 2021-11-22 LAB — COMPREHENSIVE METABOLIC PANEL
Albumin: 4.1 (ref 3.5–5.0)
Calcium: 9 (ref 8.7–10.7)

## 2021-11-22 LAB — CBC: RBC: 4.01 (ref 3.87–5.11)

## 2021-12-03 DIAGNOSIS — L570 Actinic keratosis: Secondary | ICD-10-CM | POA: Diagnosis not present

## 2021-12-03 DIAGNOSIS — L821 Other seborrheic keratosis: Secondary | ICD-10-CM | POA: Diagnosis not present

## 2021-12-03 DIAGNOSIS — L820 Inflamed seborrheic keratosis: Secondary | ICD-10-CM | POA: Diagnosis not present

## 2021-12-03 DIAGNOSIS — L578 Other skin changes due to chronic exposure to nonionizing radiation: Secondary | ICD-10-CM | POA: Diagnosis not present

## 2022-02-10 DIAGNOSIS — H43811 Vitreous degeneration, right eye: Secondary | ICD-10-CM | POA: Diagnosis not present

## 2022-02-10 DIAGNOSIS — H25013 Cortical age-related cataract, bilateral: Secondary | ICD-10-CM | POA: Diagnosis not present

## 2022-02-10 DIAGNOSIS — H2513 Age-related nuclear cataract, bilateral: Secondary | ICD-10-CM | POA: Diagnosis not present

## 2022-03-04 DIAGNOSIS — Z1231 Encounter for screening mammogram for malignant neoplasm of breast: Secondary | ICD-10-CM | POA: Diagnosis not present

## 2022-03-05 DIAGNOSIS — H25013 Cortical age-related cataract, bilateral: Secondary | ICD-10-CM | POA: Diagnosis not present

## 2022-03-05 DIAGNOSIS — H2513 Age-related nuclear cataract, bilateral: Secondary | ICD-10-CM | POA: Diagnosis not present

## 2022-03-05 DIAGNOSIS — H43811 Vitreous degeneration, right eye: Secondary | ICD-10-CM | POA: Diagnosis not present

## 2022-03-13 DIAGNOSIS — R2681 Unsteadiness on feet: Secondary | ICD-10-CM | POA: Diagnosis not present

## 2022-03-13 DIAGNOSIS — Z6824 Body mass index (BMI) 24.0-24.9, adult: Secondary | ICD-10-CM | POA: Diagnosis not present

## 2022-04-18 DIAGNOSIS — G9389 Other specified disorders of brain: Secondary | ICD-10-CM | POA: Diagnosis not present

## 2022-04-18 DIAGNOSIS — R2689 Other abnormalities of gait and mobility: Secondary | ICD-10-CM | POA: Diagnosis not present

## 2022-04-18 DIAGNOSIS — I679 Cerebrovascular disease, unspecified: Secondary | ICD-10-CM | POA: Diagnosis not present

## 2022-06-04 ENCOUNTER — Ambulatory Visit: Payer: Medicare HMO | Admitting: Diagnostic Neuroimaging

## 2022-06-04 ENCOUNTER — Telehealth: Payer: Self-pay | Admitting: Diagnostic Neuroimaging

## 2022-06-04 ENCOUNTER — Encounter: Payer: Self-pay | Admitting: Diagnostic Neuroimaging

## 2022-06-04 VITALS — BP 139/83 | HR 82 | Ht 63.0 in | Wt 143.0 lb

## 2022-06-04 DIAGNOSIS — R69 Illness, unspecified: Secondary | ICD-10-CM | POA: Diagnosis not present

## 2022-06-04 DIAGNOSIS — F419 Anxiety disorder, unspecified: Secondary | ICD-10-CM

## 2022-06-04 DIAGNOSIS — R269 Unspecified abnormalities of gait and mobility: Secondary | ICD-10-CM

## 2022-06-04 DIAGNOSIS — R471 Dysarthria and anarthria: Secondary | ICD-10-CM | POA: Diagnosis not present

## 2022-06-04 DIAGNOSIS — F32A Depression, unspecified: Secondary | ICD-10-CM

## 2022-06-04 DIAGNOSIS — R270 Ataxia, unspecified: Secondary | ICD-10-CM

## 2022-06-04 NOTE — Patient Instructions (Signed)
-   check [facility] panel  - refer to PT evaluation; use cane / walker

## 2022-06-04 NOTE — Progress Notes (Signed)
[facility]  PATIENT: Chelsea Norman DOB: 04/08/1951  REFERRING CLINICIAN: Ernestene Kiel, MD HISTORY FROM: patient REASON FOR VISIT: new consult   HISTORICAL  CHIEF COMPLAINT:  Chief Complaint  Patient presents with   Follow-up    Rm 6 alone Pt is well, she has had some imbalance since she was in her 64's. In the 5 yrs it has worsen. Two siblings have 61    HISTORY OF PRESENT ILLNESS:   71 year old female here for evaluation of gait and balance difficulty.  Patient recalls mild gait and balance difficulty issue starting right age 57 years old.  She had some evaluation by 2 neurologist but no specific cause was found.  Symptoms have gradually progressed over time.  Now having more significant staggering, uneasy sensation, vision abnormality, and slurred speech.  Symptoms particularly worse in the last couple of years.  She has had multiple falls and injuries.  Of note patient has 2 siblings with a different movement disorder which patient reports as "myoclonus dystonia" syndrome.  1 sibling has been treated with DBS.  The other sibling is treated conservatively.  They had more significant muscle jerks and spasms.  Patient denies any of these for herself.   REVIEW OF SYSTEMS: Full 14 system review of systems performed and negative with exception of: as per HPI.  ALLERGIES: Allergies  Allergen Reactions   Penicillins     HOME MEDICATIONS: Outpatient Medications Prior to Visit  Medication Sig Dispense Refill   aspirin EC 81 MG tablet Take 81 mg by mouth daily. Swallow whole.     Coenzyme Q10 (CO Q 10) 100 MG CAPS Take 100 mg by mouth daily. With meal     Cyanocobalamin (B-12) 500 MCG TABS Take by mouth.     fexofenadine (ALLEGRA) 180 MG tablet Take 180 mg by mouth daily.     fluticasone (FLONASE) 50 MCG/ACT nasal spray Place 1 spray into both nostrils daily.     LORazepam (ATIVAN) 0.5 MG tablet Take 0.5 mg by mouth every 8  (eight) hours.     venlafaxine XR (EFFEXOR-XR) 150 MG 24 hr capsule Take 150 mg by mouth daily with breakfast.     Ascorbic Acid (VITAMIN C) 500 MG CAPS Take 500 mg by mouth daily. (Patient not taking: Reported on 06/04/2022)     Calcium Citrate-Vitamin D (CITRACAL + D PO) Take 200 mg by mouth in the morning and at bedtime. 315-'200mg'$  With meals 2x per day (Patient not taking: Reported on 06/04/2022)     calcium-vitamin D (OSCAL WITH D) 500-200 MG-UNIT tablet Take 1 tablet by mouth. (Patient not taking: Reported on 06/04/2022)     desonide (DESOWEN) 0.05 % cream Apply 1 Application topically 2 (two) times daily. (Patient not taking: Reported on 06/04/2022)     meclizine (ANTIVERT) 12.5 MG tablet Take 12.5 mg by mouth as needed for dizziness (1 Tablet every 12hrs). (Patient not taking: Reported on 06/04/2022)     pravastatin (PRAVACHOL) 10 MG tablet Take 10 mg by mouth once a week. 1 Tablet by mouth Once a Week (Patient not taking: Reported on 06/04/2022)     vitamin B-12 (CYANOCOBALAMIN) 500 MCG tablet Take 500 mcg by mouth daily. (Patient not taking: Reported on 06/04/2022)     Facility-Administered Medications Prior to Visit  Medication Dose Route Frequency Provider Last Rate Last Admin   triamcinolone acetonide (KENALOG) 10 MG/ML injection 10 mg  10 mg Other Once Landis Martins, DPM        PAST  MEDICAL HISTORY: Past Medical History:  Diagnosis Date   anal cancer dx'd 2018   chemo and XRT   Anxiety    Colon polyps    Depression    Diverticulosis    Hypercholesterolemia    Hyperlipidemia    Hypertension    IBS (irritable bowel syndrome)    Osteopenia    Small vessel disease, cerebrovascular    Thyroid nodule    Vertigo     PAST SURGICAL HISTORY: Past Surgical History:  Procedure Laterality Date   ADENOIDECTOMY     CHOLECYSTECTOMY     TONSILLECTOMY      FAMILY HISTORY: Family History  Problem Relation Age of Onset   COPD Mother    Dystonia Sister    Dystonia Brother     COPD Brother     SOCIAL HISTORY: Social History   Socioeconomic History   Marital status: Married    Spouse name: LARRY   Number of children: 1   Years of education: Not on file   Highest education level: Not on file  Occupational History   Occupation: RETIRED   Occupation: [employer]  Tobacco Use   Smoking status: Former   Smokeless tobacco: Never  Scientific laboratory technician Use: Never used  Substance and Sexual Activity   Alcohol use: Yes    Alcohol/week: 2.0 standard drinks of alcohol    Types: 2 Cans of beer per week    Comment: DAILY   Drug use: Never   Sexual activity: Yes    Birth control/protection: Post-menopausal  Other Topics Concern   Not on file  Social History Narrative   Not on file   Social Determinants of Health   Financial Resource Strain: Not on file  Food Insecurity: Not on file  Transportation Needs: Not on file  Physical Activity: Not on file  Stress: Not on file  Social Connections: Not on file  Intimate Partner Violence: Not on file     PHYSICAL EXAM  GENERAL EXAM/CONSTITUTIONAL: Vitals:  Vitals:   06/04/22 1030  BP: 139/83  Pulse: 82  Weight: 143 lb (64.9 kg)  Height: '5\' 3"'$  (1.6 m)   Body mass index is 25.33 kg/m. Wt Readings from Last 3 Encounters:  06/04/22 143 lb (64.9 kg)  11/22/21 142 lb 6.4 oz (64.6 kg)  05/24/21 143 lb 3.2 oz (65 kg)   Patient is in no distress; well developed, nourished and groomed; neck is supple  CARDIOVASCULAR: Examination of carotid arteries is normal; no carotid bruits Regular rate and rhythm, no murmurs Examination of peripheral vascular system by observation and palpation is normal  EYES: Ophthalmoscopic exam of optic discs and posterior segments is normal; no papilledema or hemorrhages No results found.  MUSCULOSKELETAL: Gait, strength, tone, movements noted in Neurologic exam below  NEUROLOGIC: MENTAL STATUS:      No data to display         awake, alert, oriented to  person, place and time recent and remote memory intact normal attention and concentration language fluent, comprehension intact, naming intact fund of knowledge appropriate  CRANIAL NERVE:  2nd - no papilledema on fundoscopic exam 2nd, 3rd, 4th, 6th - pupils equal and reactive to light, visual fields full to confrontation, extraocular muscles intact, NYSTAGMUS SUSTAINED ON RIGHT AND LEFT GAZE 5th - facial sensation symmetric 7th - facial strength symmetric 8th - hearing intact 9th - palate elevates symmetrically, uvula midline 11th - shoulder shrug symmetric 12th - tongue protrusion midline MILD DYSARTHRIA  MOTOR:  normal bulk and tone, full strength in the BUE, BLE  SENSORY:  normal and symmetric to light touch, temperature, vibration  COORDINATION:  finger-nose-finger, fine finger movements --> DYSMETRIA WITH LUE  REFLEXES:  deep tendon reflexes 2+ and symmetric  GAIT/STATION:  narrow based gait; UNSTEADY, SPASTIC GAIT; STAGGERING     DIAGNOSTIC DATA (LABS, IMAGING, TESTING) - I reviewed patient records, labs, notes, testing and imaging myself where available.  Lab Results  Component Value Date   WBC 4.3 11/22/2021   HGB 13.4 11/22/2021   HCT 40 11/22/2021   PLT 227 11/22/2021      Component Value Date/Time   NA 139 11/22/2021 0000   K 4.4 11/22/2021 0000   CL 105 11/22/2021 0000   CO2 25 (A) 11/22/2021 0000   BUN 17 11/22/2021 0000   CREATININE 0.7 11/22/2021 0000   CREATININE 0.80 05/14/2020 1304   CALCIUM 9.0 11/22/2021 0000   ALBUMIN 4.1 11/22/2021 0000   AST 26 11/22/2021 0000   ALT 17 11/22/2021 0000   ALKPHOS 48 11/22/2021 0000   No results found for: "CHOL", "HDL", "LDLCALC", "LDLDIRECT", "TRIG", "CHOLHDL" No results found for: "HGBA1C" No results found for: "VITAMINB12" No results found for: "TSH"   11/06/09 MRI brain [I reviewed images myself and agree with interpretation. -VRP]  1. No acute intracranial abnormality. 2. Mild for age  nonspecific white matter signal changes. Differential includes small vessel ischemia, sequelae of hypercoagulable state, vasculitis, migraines, prior infection, demyelination (not favored).  04/18/22 MRI brain [I reviewed images myself and agree with interpretation. -VRP]  1. Negative for metastatic disease to the brain. 2. Moderate white matter changes most consistent with chronic microvascular ischemia.   ASSESSMENT AND PLAN  71 y.o. year old female here with:   Dx:  1. Gait difficulty   2. Anxiety   3. Depression, unspecified depression type   4. Dysarthria   5. Ataxia     PLAN:  ATAXIA / DYSARTHRIA / NYSTAGMUS (family history of myoclonus-dystonia; ? patient may have spinocerebellar ataxia or other neurodegenerative disorder) - check SCA panel - refer to PT evaluation; use cane / walker  Orders Placed This Encounter  Procedures   Comp Spinocerebellar Ataxia   Ambulatory referral to Physical Therapy   Return for pending test results, pending if symptoms worsen or fail to improve.    Penni Bombard, MD 96/78/9381, 01:75 AM Certified in Neurology, Neurophysiology and Neuroimaging  [facility] [address] [place], [place] [address] [phone]

## 2022-06-04 NOTE — Telephone Encounter (Signed)
Referral for physical therapy fax to [facility]. Phone: [phone], Fax: [phone]

## 2022-06-24 ENCOUNTER — Telehealth: Payer: Self-pay | Admitting: Diagnostic Neuroimaging

## 2022-06-24 DIAGNOSIS — G118 Other hereditary ataxias: Secondary | ICD-10-CM

## 2022-06-24 NOTE — Telephone Encounter (Signed)
Labs are still pending for the blood work that was collected on 06/07/2022. As of this moment I called [facility] and spoke with Lelon Frohlich who was able to pull the lab and determined that this lab is still in process and can expect a 24-28 business day turn around time.

## 2022-06-24 NOTE — Telephone Encounter (Signed)
Pt checking on status of blood work results. Would like a call from the nurse.

## 2022-07-05 DIAGNOSIS — J029 Acute pharyngitis, unspecified: Secondary | ICD-10-CM | POA: Diagnosis not present

## 2022-07-05 DIAGNOSIS — R5383 Other fatigue: Secondary | ICD-10-CM | POA: Diagnosis not present

## 2022-07-07 NOTE — Telephone Encounter (Signed)
Called and spoke to [place] at [facility] and she was able to tell me the lab that is responsible for this particular order is in [place], she was going to call and reach back out. The [facility] in [place] has a number of [phone] and askmng'[email or web address] . Stated to call back on 01.16.24 if no one has reached out.

## 2022-07-08 NOTE — Telephone Encounter (Signed)
Called and spoke with Sherell at [facility] who states that she does see the lab is still pending. States the States the turn around time is 24-28 business days so weekends are not counted in that time frame. If I calculated right we should have a response by Jan 26/29 th. Called the patient and made her aware. Advised the patient to call us on 07/21/22 if she has not heard from [place].  Pt has not started working with PT yet but has first visit scheduled 07/11/22.

## 2022-07-08 NOTE — Telephone Encounter (Signed)
Pt is calling. Following-up on lab results.

## 2022-07-11 DIAGNOSIS — R270 Ataxia, unspecified: Secondary | ICD-10-CM | POA: Diagnosis not present

## 2022-07-11 LAB — COMP SPINOCEREBELLAR ATAXIA

## 2022-07-15 DIAGNOSIS — R270 Ataxia, unspecified: Secondary | ICD-10-CM | POA: Diagnosis not present

## 2022-07-16 DIAGNOSIS — R270 Ataxia, unspecified: Secondary | ICD-10-CM | POA: Diagnosis not present

## 2022-07-17 DIAGNOSIS — H43811 Vitreous degeneration, right eye: Secondary | ICD-10-CM | POA: Diagnosis not present

## 2022-07-17 DIAGNOSIS — H524 Presbyopia: Secondary | ICD-10-CM | POA: Diagnosis not present

## 2022-07-17 DIAGNOSIS — H2513 Age-related nuclear cataract, bilateral: Secondary | ICD-10-CM | POA: Diagnosis not present

## 2022-07-17 DIAGNOSIS — H25013 Cortical age-related cataract, bilateral: Secondary | ICD-10-CM | POA: Diagnosis not present

## 2022-07-22 DIAGNOSIS — R270 Ataxia, unspecified: Secondary | ICD-10-CM | POA: Diagnosis not present

## 2022-07-22 NOTE — Telephone Encounter (Signed)
Received the fax. Uploaded to phone message and placed hard copy on desk for Dr Leta Baptist to review and result for the patient.

## 2022-07-22 NOTE — Telephone Encounter (Signed)
Called [facility] and spoke with Truman Hayward who saw the results were ready. He is faxing to me so that Dr Leta Baptist can review.

## 2022-07-22 NOTE — Telephone Encounter (Signed)
Pt called wanting to know if her lab results have come in yet. Please advise.

## 2022-07-23 DIAGNOSIS — R270 Ataxia, unspecified: Secondary | ICD-10-CM | POA: Diagnosis not present

## 2022-07-24 DIAGNOSIS — C210 Malignant neoplasm of anus, unspecified: Secondary | ICD-10-CM | POA: Diagnosis not present

## 2022-07-24 DIAGNOSIS — K5790 Diverticulosis of intestine, part unspecified, without perforation or abscess without bleeding: Secondary | ICD-10-CM | POA: Diagnosis not present

## 2022-07-24 DIAGNOSIS — G118 Other hereditary ataxias: Secondary | ICD-10-CM | POA: Insufficient documentation

## 2022-07-24 NOTE — Progress Notes (Incomplete)
[facility]  [address] [place],  [place]  [address] [phone]  Clinic Day:  07/24/2022  Referring physician: Haydee Salter, NP  HISTORY OF PRESENT ILLNESS:  The patient is a 72 y.o. 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  She completed all of her definitive chemoradiation in late February 2019, with her chemotherapy consisting of infusional 5-fluorouracil/mitomycin C.  She comes in today for routine follow-up.  Since her last visit, the patient has been doing well.  Other than rare bleeding with hard stools, she denies having other findings which concern her for recurrent anal cancer being present.    PHYSICAL EXAM:  There were no vitals taken for this visit. Wt Readings from Last 3 Encounters:  06/04/22 143 lb (64.9 kg)  11/22/21 142 lb 6.4 oz (64.6 kg)  05/24/21 143 lb 3.2 oz (65 kg)   There is no height or weight on file to calculate BMI. Performance status (ECOG): 0 Physical Exam Constitutional:      Appearance: Normal appearance. She is not ill-appearing.  HENT:     Mouth/Throat:     Mouth: Mucous membranes are moist.     Pharynx: Oropharynx is clear. No oropharyngeal exudate or posterior oropharyngeal erythema.  Cardiovascular:     Rate and Rhythm: Normal rate and regular rhythm.     Heart sounds: No murmur heard.    No friction rub. No gallop.  Pulmonary:     Effort: Pulmonary effort is normal. No respiratory distress.     Breath sounds: Normal breath sounds. No wheezing, rhonchi or rales.  Abdominal:     General: Bowel sounds are normal. There is no distension.     Palpations: Abdomen is soft. There is no mass.     Tenderness: There is no abdominal tenderness.     Comments: Normal anorectal examination  Genitourinary:    Rectum: Normal. No mass, anal fissure or external hemorrhoid. Normal anal tone.  Musculoskeletal:        General: No swelling.     Right lower leg: No edema.     Left lower leg: No edema.   Lymphadenopathy:     Cervical: No cervical adenopathy.     Upper Body:     Right upper body: No supraclavicular or axillary adenopathy.     Left upper body: No supraclavicular or axillary adenopathy.     Lower Body: No right inguinal adenopathy. No left inguinal adenopathy.  Skin:    General: Skin is warm.     Coloration: Skin is not jaundiced.     Findings: No lesion or rash.  Neurological:     General: No focal deficit present.     Mental Status: She is alert and oriented to person, place, and time. Mental status is at baseline.  Psychiatric:        Mood and Affect: Mood normal.        Behavior: Behavior normal.        Thought Content: Thought content normal.    LABS:      Latest Ref Rng & Units 11/22/2021   12:00 AM 05/23/2021   12:00 AM 11/20/2020   12:00 AM  CBC  WBC  4.3     4.8  4.7   Hemoglobin 12.0 - 16.0 13.4     13.8  13.7   Hematocrit 36 - 46 40     41  40   Platelets 150 - 400 K/uL 227  243  224      This result is from an external source.       Latest Ref Rng & Units 11/22/2021   12:00 AM 05/23/2021   12:00 AM 11/20/2020   12:00 AM  CMP  BUN 4 - '21 17     12  13   '$ Creatinine 0.5 - 1.1 0.7     0.8  0.8   Sodium 137 - 147 139     135  134   Potassium 3.5 - 5.1 mEq/L 4.4     3.9  4.5   Chloride 99 - 108 105     99  103   CO2 13 - '22 25     29  26   '$ Calcium 8.7 - 10.7 9.0     9.4  8.7   Alkaline Phos 25 - 125 48     60  59   AST 13 - 35 '26     29  25   '$ ALT 7 - 35 U/L '17     18  16      '$ This result is from an external source.    ASSESSMENT & PLAN:  Assessment/Plan:  A 72 y.o. female with stage IIA squamous cell anal cancer, status post completion of all of her definitive chemoradiation in February 2019.  Based upon her physical exam today, the patient remains disease-free.  Clinically, the patient is doing very well.  I will see her back in February 2024 for repeat clinical assessment.  If her next exam is normal, it will mark her being 5 years cancer  free for which I would consider her cured of disease.  The patient understands all the plans discussed today and is in agreement with them.    Chelsea Herrig Macarthur Critchley, MD

## 2022-07-24 NOTE — Telephone Encounter (Signed)
I called patient, and reviewed results. Diagnosis is SCA6. Reviewed diagnosis and prognosis. Patient understands and will call us for any future issues. May follow up in 6-12 months with [place] or as needed. She would like as needed follow up for now.   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

## 2022-07-24 NOTE — Progress Notes (Unsigned)
[facility]  [address] [place],  [place]  [address] [phone]  Clinic Day: 07/25/2022  Referring physician: Rolm Gala, NP  HISTORY OF PRESENT ILLNESS:  The patient is a 72 y.o. 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  She completed all of her definitive chemoradiation in February 2019, with her chemotherapy consisting of infusional 5-fluorouracil/mitomycin C.  She comes in today for routine follow-up.  Since her last visit, the patient has been doing well.  Other than rare bleeding with hard stools, she denies having other findings which concern her for recurrent anal cancer being present.  The patient brings to my attention that she was recently diagnosed with spinocerebellar ataxia by a neurologist in [place].  She wishes to receive a second opinion from a neurologist at [facility] concerning this diagnosis.  PHYSICAL EXAM:  Blood pressure (!) 148/83, pulse 83, temperature 98.6 F (37 C), resp. rate 14, height 5\' 3"  (1.6 m), weight 140 lb 8 oz (63.7 kg), SpO2 96 %. Wt Readings from Last 3 Encounters:  07/25/22 140 lb 8 oz (63.7 kg)  06/04/22 143 lb (64.9 kg)  11/22/21 142 lb 6.4 oz (64.6 kg)   Body mass index is 24.89 kg/m. Performance status (ECOG): 0 Physical Exam Constitutional:      Appearance: Normal appearance. She is not ill-appearing.  HENT:     Mouth/Throat:     Mouth: Mucous membranes are moist.     Pharynx: Oropharynx is clear. No oropharyngeal exudate or posterior oropharyngeal erythema.  Cardiovascular:     Rate and Rhythm: Normal rate and regular rhythm.     Heart sounds: No murmur heard.    No friction rub. No gallop.  Pulmonary:     Effort: Pulmonary effort is normal. No respiratory distress.     Breath sounds: Normal breath sounds. No wheezing, rhonchi or rales.  Abdominal:     General: Bowel sounds are normal. There is no distension.     Palpations: Abdomen is soft. There is no mass.      Tenderness: There is no abdominal tenderness.     Comments: Normal anorectal examination  Genitourinary:    Rectum: Normal. No mass, anal fissure or external hemorrhoid. Normal anal tone.  Musculoskeletal:        General: No swelling.     Right lower leg: No edema.     Left lower leg: No edema.  Lymphadenopathy:     Cervical: No cervical adenopathy.     Upper Body:     Right upper body: No supraclavicular or axillary adenopathy.     Left upper body: No supraclavicular or axillary adenopathy.     Lower Body: No right inguinal adenopathy. No left inguinal adenopathy.  Skin:    General: Skin is warm.     Coloration: Skin is not jaundiced.     Findings: No lesion or rash.  Neurological:     General: No focal deficit present.     Mental Status: She is alert and oriented to person, place, and time. Mental status is at baseline.  Psychiatric:        Mood and Affect: Mood normal.        Behavior: Behavior normal.        Thought Content: Thought content normal.    LABS:      Latest Ref Rng & Units 07/25/2022    9:51 AM 11/22/2021   12:00 AM 05/23/2021   12:00 AM  CBC  WBC  3.8     4.3     4.8   Hemoglobin 12.0 - 16.0 13.8     13.4     13.8   Hematocrit 36 - 46 40     40     41   Platelets 150 - 400 K/uL 212     227     243      This result is from an external source.      Latest Ref Rng & Units 07/25/2022    9:51 AM 11/22/2021   12:00 AM 05/23/2021   12:00 AM  CMP  BUN 4 - 21 10     17     12    Creatinine 0.5 - 1.1 0.7     0.7     0.8   Sodium 137 - 147 134     139     135   Potassium 3.5 - 5.1 mEq/L 4.3     4.4     3.9   Chloride 99 - 108 101     105     99   CO2 13 - 22 27     25     29    Calcium 8.7 - 10.7 9.2     9.0     9.4   Alkaline Phos 25 - 125 50     48     60   AST 13 - 35 25     26     29    ALT 7 - 35 U/L 16     17     18       This result is from an external source.   ASSESSMENT & PLAN:  Assessment/Plan:  A 72 y.o. female with stage IIA squamous cell anal  cancer, status post completion of all of her definitive chemoradiation in February 2019.  Based upon her physical exam today, the patient remains disease-free.  As she has been cancer free for 5 years, I am now consider her cured of her anal cancer.  Understandably, the patient was pleased with this information.  Per her request, I will refer her to [facility] neurology so they may evaluate and confirm her recent diagnosis of spinocerebellar ataxia.  Otherwise, as she is cured of her anal cancer, I will turn her care back over to her other physicians.  The patient understands all the plans discussed today and is in agreement with them.    Margean Korell Kirby Funk, MD

## 2022-07-25 ENCOUNTER — Inpatient Hospital Stay: Payer: Medicare HMO | Attending: Oncology

## 2022-07-25 ENCOUNTER — Other Ambulatory Visit: Payer: Medicare HMO

## 2022-07-25 ENCOUNTER — Ambulatory Visit: Payer: Medicare HMO | Admitting: Oncology

## 2022-07-25 ENCOUNTER — Inpatient Hospital Stay: Payer: Medicare HMO | Admitting: Oncology

## 2022-07-25 VITALS — BP 148/83 | HR 83 | Temp 98.6°F | Resp 14 | Ht 63.0 in | Wt 140.5 lb

## 2022-07-25 DIAGNOSIS — C211 Malignant neoplasm of anal canal: Secondary | ICD-10-CM

## 2022-07-25 DIAGNOSIS — D649 Anemia, unspecified: Secondary | ICD-10-CM | POA: Diagnosis not present

## 2022-07-25 LAB — CBC AND DIFFERENTIAL
HCT: 40 (ref 36–46)
Hemoglobin: 13.8 (ref 12.0–16.0)
Neutrophils Absolute: 1.86
Platelets: 212 10*3/uL (ref 150–400)
WBC: 3.8

## 2022-07-25 LAB — COMPREHENSIVE METABOLIC PANEL
Albumin: 4 (ref 3.5–5.0)
Calcium: 9.2 (ref 8.7–10.7)

## 2022-07-25 LAB — BASIC METABOLIC PANEL
BUN: 10 (ref 4–21)
CO2: 27 — AB (ref 13–22)
Chloride: 101 (ref 99–108)
Creatinine: 0.7 (ref 0.5–1.1)
Glucose: 99
Potassium: 4.3 mEq/L (ref 3.5–5.1)
Sodium: 134 — AB (ref 137–147)

## 2022-07-25 LAB — HEPATIC FUNCTION PANEL
ALT: 16 U/L (ref 7–35)
AST: 25 (ref 13–35)
Alkaline Phosphatase: 50 (ref 25–125)
Bilirubin, Total: 0.5

## 2022-07-25 LAB — CBC: RBC: 4.03 (ref 3.87–5.11)

## 2022-07-30 DIAGNOSIS — G3281 Cerebellar ataxia in diseases classified elsewhere: Secondary | ICD-10-CM | POA: Diagnosis not present

## 2022-07-30 DIAGNOSIS — R270 Ataxia, unspecified: Secondary | ICD-10-CM | POA: Diagnosis not present

## 2022-08-06 DIAGNOSIS — G3281 Cerebellar ataxia in diseases classified elsewhere: Secondary | ICD-10-CM | POA: Diagnosis not present

## 2022-08-06 DIAGNOSIS — R270 Ataxia, unspecified: Secondary | ICD-10-CM | POA: Diagnosis not present

## 2022-08-07 DIAGNOSIS — R270 Ataxia, unspecified: Secondary | ICD-10-CM | POA: Diagnosis not present

## 2022-08-07 DIAGNOSIS — G3281 Cerebellar ataxia in diseases classified elsewhere: Secondary | ICD-10-CM | POA: Diagnosis not present

## 2022-08-13 DIAGNOSIS — G3281 Cerebellar ataxia in diseases classified elsewhere: Secondary | ICD-10-CM | POA: Diagnosis not present

## 2022-08-13 DIAGNOSIS — R270 Ataxia, unspecified: Secondary | ICD-10-CM | POA: Diagnosis not present

## 2022-08-15 DIAGNOSIS — G3281 Cerebellar ataxia in diseases classified elsewhere: Secondary | ICD-10-CM | POA: Diagnosis not present

## 2022-08-15 DIAGNOSIS — R270 Ataxia, unspecified: Secondary | ICD-10-CM | POA: Diagnosis not present

## 2022-08-18 DIAGNOSIS — Z6824 Body mass index (BMI) 24.0-24.9, adult: Secondary | ICD-10-CM | POA: Diagnosis not present

## 2022-08-18 DIAGNOSIS — Z0000 Encounter for general adult medical examination without abnormal findings: Secondary | ICD-10-CM | POA: Diagnosis not present

## 2022-08-18 DIAGNOSIS — G3281 Cerebellar ataxia in diseases classified elsewhere: Secondary | ICD-10-CM | POA: Diagnosis not present

## 2022-08-18 DIAGNOSIS — Z79899 Other long term (current) drug therapy: Secondary | ICD-10-CM | POA: Diagnosis not present

## 2022-08-18 DIAGNOSIS — E785 Hyperlipidemia, unspecified: Secondary | ICD-10-CM | POA: Diagnosis not present

## 2022-08-18 DIAGNOSIS — R270 Ataxia, unspecified: Secondary | ICD-10-CM | POA: Diagnosis not present

## 2022-08-18 DIAGNOSIS — Z1331 Encounter for screening for depression: Secondary | ICD-10-CM | POA: Diagnosis not present

## 2022-08-18 DIAGNOSIS — G118 Other hereditary ataxias: Secondary | ICD-10-CM | POA: Diagnosis not present

## 2022-08-19 DIAGNOSIS — G118 Other hereditary ataxias: Secondary | ICD-10-CM | POA: Diagnosis not present

## 2022-08-20 DIAGNOSIS — G3281 Cerebellar ataxia in diseases classified elsewhere: Secondary | ICD-10-CM | POA: Diagnosis not present

## 2022-08-20 DIAGNOSIS — R270 Ataxia, unspecified: Secondary | ICD-10-CM | POA: Diagnosis not present

## 2022-08-22 DIAGNOSIS — Z08 Encounter for follow-up examination after completed treatment for malignant neoplasm: Secondary | ICD-10-CM | POA: Diagnosis not present

## 2022-08-22 DIAGNOSIS — C210 Malignant neoplasm of anus, unspecified: Secondary | ICD-10-CM | POA: Diagnosis not present

## 2022-08-22 DIAGNOSIS — K627 Radiation proctitis: Secondary | ICD-10-CM | POA: Diagnosis not present

## 2022-08-22 DIAGNOSIS — Z85048 Personal history of other malignant neoplasm of rectum, rectosigmoid junction, and anus: Secondary | ICD-10-CM | POA: Diagnosis not present

## 2022-08-22 DIAGNOSIS — Z86010 Personal history of colonic polyps: Secondary | ICD-10-CM | POA: Diagnosis not present

## 2022-08-22 DIAGNOSIS — K5730 Diverticulosis of large intestine without perforation or abscess without bleeding: Secondary | ICD-10-CM | POA: Diagnosis not present

## 2022-08-22 DIAGNOSIS — Z923 Personal history of irradiation: Secondary | ICD-10-CM | POA: Diagnosis not present

## 2022-08-22 DIAGNOSIS — Z1211 Encounter for screening for malignant neoplasm of colon: Secondary | ICD-10-CM | POA: Diagnosis not present

## 2022-08-25 DIAGNOSIS — R270 Ataxia, unspecified: Secondary | ICD-10-CM | POA: Diagnosis not present

## 2022-08-25 DIAGNOSIS — G3281 Cerebellar ataxia in diseases classified elsewhere: Secondary | ICD-10-CM | POA: Diagnosis not present

## 2022-08-27 DIAGNOSIS — R270 Ataxia, unspecified: Secondary | ICD-10-CM | POA: Diagnosis not present

## 2022-08-27 DIAGNOSIS — G3281 Cerebellar ataxia in diseases classified elsewhere: Secondary | ICD-10-CM | POA: Diagnosis not present

## 2022-09-01 DIAGNOSIS — G3281 Cerebellar ataxia in diseases classified elsewhere: Secondary | ICD-10-CM | POA: Diagnosis not present

## 2022-09-01 DIAGNOSIS — R270 Ataxia, unspecified: Secondary | ICD-10-CM | POA: Diagnosis not present

## 2022-09-03 DIAGNOSIS — R270 Ataxia, unspecified: Secondary | ICD-10-CM | POA: Diagnosis not present

## 2022-09-03 DIAGNOSIS — G3281 Cerebellar ataxia in diseases classified elsewhere: Secondary | ICD-10-CM | POA: Diagnosis not present

## 2022-09-22 DIAGNOSIS — R269 Unspecified abnormalities of gait and mobility: Secondary | ICD-10-CM | POA: Diagnosis not present

## 2022-09-22 DIAGNOSIS — R471 Dysarthria and anarthria: Secondary | ICD-10-CM | POA: Diagnosis not present

## 2022-09-22 DIAGNOSIS — G3281 Cerebellar ataxia in diseases classified elsewhere: Secondary | ICD-10-CM | POA: Diagnosis not present

## 2022-09-22 DIAGNOSIS — R270 Ataxia, unspecified: Secondary | ICD-10-CM | POA: Diagnosis not present

## 2022-09-24 DIAGNOSIS — R779 Abnormality of plasma protein, unspecified: Secondary | ICD-10-CM | POA: Diagnosis not present

## 2022-09-29 DIAGNOSIS — R471 Dysarthria and anarthria: Secondary | ICD-10-CM | POA: Diagnosis not present

## 2022-09-29 DIAGNOSIS — R270 Ataxia, unspecified: Secondary | ICD-10-CM | POA: Diagnosis not present

## 2022-09-29 DIAGNOSIS — G3281 Cerebellar ataxia in diseases classified elsewhere: Secondary | ICD-10-CM | POA: Diagnosis not present

## 2022-09-29 DIAGNOSIS — R269 Unspecified abnormalities of gait and mobility: Secondary | ICD-10-CM | POA: Diagnosis not present

## 2022-11-06 ENCOUNTER — Encounter: Payer: Self-pay | Admitting: Diagnostic Neuroimaging

## 2022-11-06 DIAGNOSIS — G118 Other hereditary ataxias: Secondary | ICD-10-CM | POA: Diagnosis not present

## 2022-11-24 DIAGNOSIS — Z736 Limitation of activities due to disability: Secondary | ICD-10-CM | POA: Diagnosis not present

## 2022-11-24 DIAGNOSIS — G118 Other hereditary ataxias: Secondary | ICD-10-CM | POA: Diagnosis not present

## 2022-11-24 DIAGNOSIS — M6281 Muscle weakness (generalized): Secondary | ICD-10-CM | POA: Diagnosis not present

## 2022-11-24 DIAGNOSIS — R5381 Other malaise: Secondary | ICD-10-CM | POA: Diagnosis not present

## 2022-12-04 DIAGNOSIS — M6281 Muscle weakness (generalized): Secondary | ICD-10-CM | POA: Diagnosis not present

## 2022-12-04 DIAGNOSIS — R5381 Other malaise: Secondary | ICD-10-CM | POA: Diagnosis not present

## 2022-12-04 DIAGNOSIS — L578 Other skin changes due to chronic exposure to nonionizing radiation: Secondary | ICD-10-CM | POA: Diagnosis not present

## 2022-12-04 DIAGNOSIS — L821 Other seborrheic keratosis: Secondary | ICD-10-CM | POA: Diagnosis not present

## 2022-12-04 DIAGNOSIS — Z736 Limitation of activities due to disability: Secondary | ICD-10-CM | POA: Diagnosis not present

## 2022-12-04 DIAGNOSIS — G118 Other hereditary ataxias: Secondary | ICD-10-CM | POA: Diagnosis not present

## 2022-12-04 DIAGNOSIS — L570 Actinic keratosis: Secondary | ICD-10-CM | POA: Diagnosis not present

## 2022-12-08 DIAGNOSIS — Z79899 Other long term (current) drug therapy: Secondary | ICD-10-CM | POA: Diagnosis not present

## 2022-12-08 DIAGNOSIS — E7800 Pure hypercholesterolemia, unspecified: Secondary | ICD-10-CM | POA: Diagnosis not present

## 2022-12-29 ENCOUNTER — Encounter: Payer: Self-pay | Admitting: Diagnostic Neuroimaging

## 2022-12-30 DIAGNOSIS — G118 Other hereditary ataxias: Secondary | ICD-10-CM | POA: Diagnosis not present

## 2023-01-06 DIAGNOSIS — G118 Other hereditary ataxias: Secondary | ICD-10-CM | POA: Diagnosis not present

## 2023-01-12 DIAGNOSIS — G118 Other hereditary ataxias: Secondary | ICD-10-CM | POA: Diagnosis not present

## 2023-01-16 DIAGNOSIS — G118 Other hereditary ataxias: Secondary | ICD-10-CM | POA: Diagnosis not present

## 2023-01-21 DIAGNOSIS — G118 Other hereditary ataxias: Secondary | ICD-10-CM | POA: Diagnosis not present

## 2023-01-23 DIAGNOSIS — G118 Other hereditary ataxias: Secondary | ICD-10-CM | POA: Diagnosis not present

## 2023-01-26 DIAGNOSIS — G118 Other hereditary ataxias: Secondary | ICD-10-CM | POA: Diagnosis not present

## 2023-01-30 DIAGNOSIS — G118 Other hereditary ataxias: Secondary | ICD-10-CM | POA: Diagnosis not present

## 2023-02-04 DIAGNOSIS — G118 Other hereditary ataxias: Secondary | ICD-10-CM | POA: Diagnosis not present

## 2023-02-06 DIAGNOSIS — G118 Other hereditary ataxias: Secondary | ICD-10-CM | POA: Diagnosis not present

## 2023-02-10 DIAGNOSIS — G118 Other hereditary ataxias: Secondary | ICD-10-CM | POA: Diagnosis not present

## 2023-02-13 DIAGNOSIS — G118 Other hereditary ataxias: Secondary | ICD-10-CM | POA: Diagnosis not present

## 2023-02-17 DIAGNOSIS — G118 Other hereditary ataxias: Secondary | ICD-10-CM | POA: Diagnosis not present

## 2023-02-18 DIAGNOSIS — Z6824 Body mass index (BMI) 24.0-24.9, adult: Secondary | ICD-10-CM | POA: Diagnosis not present

## 2023-02-18 DIAGNOSIS — F419 Anxiety disorder, unspecified: Secondary | ICD-10-CM | POA: Diagnosis not present

## 2023-02-18 DIAGNOSIS — E7800 Pure hypercholesterolemia, unspecified: Secondary | ICD-10-CM | POA: Diagnosis not present

## 2023-02-18 DIAGNOSIS — G118 Other hereditary ataxias: Secondary | ICD-10-CM | POA: Diagnosis not present

## 2023-02-19 DIAGNOSIS — G118 Other hereditary ataxias: Secondary | ICD-10-CM | POA: Diagnosis not present

## 2023-02-24 DIAGNOSIS — G118 Other hereditary ataxias: Secondary | ICD-10-CM | POA: Diagnosis not present

## 2023-03-04 DIAGNOSIS — G118 Other hereditary ataxias: Secondary | ICD-10-CM | POA: Diagnosis not present

## 2023-03-06 DIAGNOSIS — G118 Other hereditary ataxias: Secondary | ICD-10-CM | POA: Diagnosis not present

## 2023-03-09 DIAGNOSIS — G118 Other hereditary ataxias: Secondary | ICD-10-CM | POA: Diagnosis not present

## 2023-03-11 ENCOUNTER — Encounter: Payer: Self-pay | Admitting: Diagnostic Neuroimaging

## 2023-03-11 DIAGNOSIS — G118 Other hereditary ataxias: Secondary | ICD-10-CM | POA: Diagnosis not present

## 2023-03-16 DIAGNOSIS — G118 Other hereditary ataxias: Secondary | ICD-10-CM | POA: Diagnosis not present

## 2023-03-17 DIAGNOSIS — G118 Other hereditary ataxias: Secondary | ICD-10-CM | POA: Diagnosis not present

## 2023-03-24 DIAGNOSIS — G118 Other hereditary ataxias: Secondary | ICD-10-CM | POA: Diagnosis not present

## 2023-03-26 DIAGNOSIS — G118 Other hereditary ataxias: Secondary | ICD-10-CM | POA: Diagnosis not present

## 2023-05-04 DIAGNOSIS — Z6824 Body mass index (BMI) 24.0-24.9, adult: Secondary | ICD-10-CM | POA: Diagnosis not present

## 2023-05-04 DIAGNOSIS — R5383 Other fatigue: Secondary | ICD-10-CM | POA: Diagnosis not present

## 2023-05-04 DIAGNOSIS — E7800 Pure hypercholesterolemia, unspecified: Secondary | ICD-10-CM | POA: Diagnosis not present

## 2023-05-04 DIAGNOSIS — Z79899 Other long term (current) drug therapy: Secondary | ICD-10-CM | POA: Diagnosis not present

## 2023-07-23 DIAGNOSIS — H524 Presbyopia: Secondary | ICD-10-CM | POA: Diagnosis not present

## 2023-08-17 DIAGNOSIS — H25811 Combined forms of age-related cataract, right eye: Secondary | ICD-10-CM | POA: Diagnosis not present

## 2023-08-20 DIAGNOSIS — F419 Anxiety disorder, unspecified: Secondary | ICD-10-CM | POA: Diagnosis not present

## 2023-08-20 DIAGNOSIS — F330 Major depressive disorder, recurrent, mild: Secondary | ICD-10-CM | POA: Diagnosis not present

## 2023-08-20 DIAGNOSIS — Z79899 Other long term (current) drug therapy: Secondary | ICD-10-CM | POA: Diagnosis not present

## 2023-08-20 DIAGNOSIS — E7800 Pure hypercholesterolemia, unspecified: Secondary | ICD-10-CM | POA: Diagnosis not present

## 2023-08-20 DIAGNOSIS — G118 Other hereditary ataxias: Secondary | ICD-10-CM | POA: Diagnosis not present

## 2023-08-26 DIAGNOSIS — H25811 Combined forms of age-related cataract, right eye: Secondary | ICD-10-CM | POA: Diagnosis not present

## 2023-08-26 DIAGNOSIS — F419 Anxiety disorder, unspecified: Secondary | ICD-10-CM | POA: Diagnosis not present

## 2023-09-02 DIAGNOSIS — H25812 Combined forms of age-related cataract, left eye: Secondary | ICD-10-CM | POA: Diagnosis not present

## 2023-09-02 DIAGNOSIS — F418 Other specified anxiety disorders: Secondary | ICD-10-CM | POA: Diagnosis not present

## 2023-09-02 DIAGNOSIS — I10 Essential (primary) hypertension: Secondary | ICD-10-CM | POA: Diagnosis not present

## 2023-09-02 DIAGNOSIS — H2512 Age-related nuclear cataract, left eye: Secondary | ICD-10-CM | POA: Diagnosis not present

## 2023-09-07 DIAGNOSIS — G118 Other hereditary ataxias: Secondary | ICD-10-CM | POA: Diagnosis not present

## 2023-09-07 DIAGNOSIS — F330 Major depressive disorder, recurrent, mild: Secondary | ICD-10-CM | POA: Diagnosis not present

## 2023-09-07 DIAGNOSIS — Z1331 Encounter for screening for depression: Secondary | ICD-10-CM | POA: Diagnosis not present

## 2023-09-07 DIAGNOSIS — Z0000 Encounter for general adult medical examination without abnormal findings: Secondary | ICD-10-CM | POA: Diagnosis not present

## 2023-09-07 DIAGNOSIS — E785 Hyperlipidemia, unspecified: Secondary | ICD-10-CM | POA: Diagnosis not present

## 2023-09-07 DIAGNOSIS — F419 Anxiety disorder, unspecified: Secondary | ICD-10-CM | POA: Diagnosis not present

## 2023-09-22 DIAGNOSIS — K080 Exfoliation of teeth due to systemic causes: Secondary | ICD-10-CM | POA: Diagnosis not present

## 2023-10-20 DIAGNOSIS — C44529 Squamous cell carcinoma of skin of other part of trunk: Secondary | ICD-10-CM | POA: Diagnosis not present

## 2023-11-03 DIAGNOSIS — C44529 Squamous cell carcinoma of skin of other part of trunk: Secondary | ICD-10-CM | POA: Diagnosis not present

## 2023-11-09 DIAGNOSIS — Z79899 Other long term (current) drug therapy: Secondary | ICD-10-CM | POA: Diagnosis not present

## 2023-11-09 DIAGNOSIS — G118 Other hereditary ataxias: Secondary | ICD-10-CM | POA: Diagnosis not present

## 2023-11-17 DIAGNOSIS — Z1231 Encounter for screening mammogram for malignant neoplasm of breast: Secondary | ICD-10-CM | POA: Diagnosis not present

## 2023-11-23 DIAGNOSIS — K5790 Diverticulosis of intestine, part unspecified, without perforation or abscess without bleeding: Secondary | ICD-10-CM | POA: Diagnosis not present

## 2023-11-23 DIAGNOSIS — K219 Gastro-esophageal reflux disease without esophagitis: Secondary | ICD-10-CM | POA: Diagnosis not present

## 2023-11-23 DIAGNOSIS — C210 Malignant neoplasm of anus, unspecified: Secondary | ICD-10-CM | POA: Diagnosis not present

## 2023-11-24 DIAGNOSIS — L821 Other seborrheic keratosis: Secondary | ICD-10-CM | POA: Diagnosis not present

## 2023-11-24 DIAGNOSIS — C44529 Squamous cell carcinoma of skin of other part of trunk: Secondary | ICD-10-CM | POA: Diagnosis not present

## 2024-02-01 DIAGNOSIS — B351 Tinea unguium: Secondary | ICD-10-CM | POA: Diagnosis not present

## 2024-02-01 DIAGNOSIS — G118 Other hereditary ataxias: Secondary | ICD-10-CM | POA: Diagnosis not present

## 2024-02-01 DIAGNOSIS — E785 Hyperlipidemia, unspecified: Secondary | ICD-10-CM | POA: Diagnosis not present

## 2024-02-01 DIAGNOSIS — F419 Anxiety disorder, unspecified: Secondary | ICD-10-CM | POA: Diagnosis not present

## 2024-02-01 DIAGNOSIS — Z79899 Other long term (current) drug therapy: Secondary | ICD-10-CM | POA: Diagnosis not present

## 2024-02-02 DIAGNOSIS — G118 Other hereditary ataxias: Secondary | ICD-10-CM | POA: Diagnosis not present

## 2024-02-23 DIAGNOSIS — R2689 Other abnormalities of gait and mobility: Secondary | ICD-10-CM | POA: Diagnosis not present

## 2024-02-29 DIAGNOSIS — R2689 Other abnormalities of gait and mobility: Secondary | ICD-10-CM | POA: Diagnosis not present

## 2024-03-04 DIAGNOSIS — R2689 Other abnormalities of gait and mobility: Secondary | ICD-10-CM | POA: Diagnosis not present

## 2024-03-08 DIAGNOSIS — Z79899 Other long term (current) drug therapy: Secondary | ICD-10-CM | POA: Diagnosis not present

## 2024-03-08 DIAGNOSIS — G118 Other hereditary ataxias: Secondary | ICD-10-CM | POA: Diagnosis not present

## 2024-03-09 DIAGNOSIS — R2689 Other abnormalities of gait and mobility: Secondary | ICD-10-CM | POA: Diagnosis not present

## 2024-03-16 DIAGNOSIS — R2689 Other abnormalities of gait and mobility: Secondary | ICD-10-CM | POA: Diagnosis not present

## 2024-03-18 DIAGNOSIS — R2689 Other abnormalities of gait and mobility: Secondary | ICD-10-CM | POA: Diagnosis not present

## 2024-03-23 DIAGNOSIS — R2689 Other abnormalities of gait and mobility: Secondary | ICD-10-CM | POA: Diagnosis not present

## 2024-03-28 DIAGNOSIS — R2689 Other abnormalities of gait and mobility: Secondary | ICD-10-CM | POA: Diagnosis not present

## 2024-03-31 DIAGNOSIS — R2689 Other abnormalities of gait and mobility: Secondary | ICD-10-CM | POA: Diagnosis not present

## 2024-03-31 DIAGNOSIS — K080 Exfoliation of teeth due to systemic causes: Secondary | ICD-10-CM | POA: Diagnosis not present

## 2024-04-12 DIAGNOSIS — QA00102 CACNA1A-related neurodevelopmental disorder: Secondary | ICD-10-CM | POA: Diagnosis not present

## 2024-04-12 DIAGNOSIS — G118 Other hereditary ataxias: Secondary | ICD-10-CM | POA: Diagnosis not present

## 2024-04-12 DIAGNOSIS — Z79899 Other long term (current) drug therapy: Secondary | ICD-10-CM | POA: Diagnosis not present

## 2024-04-13 DIAGNOSIS — R2689 Other abnormalities of gait and mobility: Secondary | ICD-10-CM | POA: Diagnosis not present

## 2024-04-14 DIAGNOSIS — K080 Exfoliation of teeth due to systemic causes: Secondary | ICD-10-CM | POA: Diagnosis not present

## 2024-04-14 DIAGNOSIS — R2689 Other abnormalities of gait and mobility: Secondary | ICD-10-CM | POA: Diagnosis not present

## 2024-04-25 DIAGNOSIS — R2689 Other abnormalities of gait and mobility: Secondary | ICD-10-CM | POA: Diagnosis not present

## 2024-04-28 DIAGNOSIS — R2689 Other abnormalities of gait and mobility: Secondary | ICD-10-CM | POA: Diagnosis not present

## 2024-05-03 DIAGNOSIS — QA00102 CACNA1A-related neurodevelopmental disorder: Secondary | ICD-10-CM | POA: Diagnosis not present

## 2024-05-03 DIAGNOSIS — Z23 Encounter for immunization: Secondary | ICD-10-CM | POA: Diagnosis not present

## 2024-05-03 DIAGNOSIS — Z87891 Personal history of nicotine dependence: Secondary | ICD-10-CM | POA: Diagnosis not present

## 2024-05-03 DIAGNOSIS — Z79899 Other long term (current) drug therapy: Secondary | ICD-10-CM | POA: Diagnosis not present

## 2024-05-03 DIAGNOSIS — G118 Other hereditary ataxias: Secondary | ICD-10-CM | POA: Diagnosis not present

## 2024-05-03 DIAGNOSIS — Z808 Family history of malignant neoplasm of other organs or systems: Secondary | ICD-10-CM | POA: Diagnosis not present

## 2024-05-04 DIAGNOSIS — R2689 Other abnormalities of gait and mobility: Secondary | ICD-10-CM | POA: Diagnosis not present

## 2024-05-05 DIAGNOSIS — R2689 Other abnormalities of gait and mobility: Secondary | ICD-10-CM | POA: Diagnosis not present

## 2024-05-09 DIAGNOSIS — R2689 Other abnormalities of gait and mobility: Secondary | ICD-10-CM | POA: Diagnosis not present

## 2024-05-13 DIAGNOSIS — R2689 Other abnormalities of gait and mobility: Secondary | ICD-10-CM | POA: Diagnosis not present

## 2024-05-18 DIAGNOSIS — R2689 Other abnormalities of gait and mobility: Secondary | ICD-10-CM | POA: Diagnosis not present

## 2024-05-19 DIAGNOSIS — S42202A Unspecified fracture of upper end of left humerus, initial encounter for closed fracture: Secondary | ICD-10-CM | POA: Diagnosis not present

## 2024-05-19 DIAGNOSIS — W19XXXA Unspecified fall, initial encounter: Secondary | ICD-10-CM | POA: Diagnosis not present

## 2024-05-19 DIAGNOSIS — Z043 Encounter for examination and observation following other accident: Secondary | ICD-10-CM | POA: Diagnosis not present

## 2024-05-19 DIAGNOSIS — S42212A Unspecified displaced fracture of surgical neck of left humerus, initial encounter for closed fracture: Secondary | ICD-10-CM | POA: Diagnosis not present

## 2024-05-23 DIAGNOSIS — S42202A Unspecified fracture of upper end of left humerus, initial encounter for closed fracture: Secondary | ICD-10-CM | POA: Diagnosis not present

## 2024-05-30 DIAGNOSIS — S42202A Unspecified fracture of upper end of left humerus, initial encounter for closed fracture: Secondary | ICD-10-CM | POA: Diagnosis not present

## 2024-06-06 DIAGNOSIS — S42202A Unspecified fracture of upper end of left humerus, initial encounter for closed fracture: Secondary | ICD-10-CM | POA: Diagnosis not present
